# Patient Record
Sex: Male | Born: 1975
Health system: Southern US, Community
[De-identification: ages and names within clinical notes are randomized; demographics above are authoritative.]

## PROBLEM LIST (undated history)

## (undated) DIAGNOSIS — E079 Disorder of thyroid, unspecified: Secondary | ICD-10-CM

## (undated) DIAGNOSIS — K219 Gastro-esophageal reflux disease without esophagitis: Secondary | ICD-10-CM

## (undated) HISTORY — PX: UPPER GASTROINTESTINAL ENDOSCOPY: SHX188

## (undated) HISTORY — PX: COLONOSCOPY: SHX174

## (undated) HISTORY — PX: VASECTOMY: SHX75

## (undated) HISTORY — DX: Disorder of thyroid, unspecified: E07.9

## (undated) HISTORY — DX: Gastro-esophageal reflux disease without esophagitis: K21.9

---

## 2010-03-25 ENCOUNTER — Other Ambulatory Visit: Payer: Self-pay | Admitting: Sports Medicine

## 2010-03-25 DIAGNOSIS — M25561 Pain in right knee: Secondary | ICD-10-CM

## 2010-03-26 ENCOUNTER — Other Ambulatory Visit: Payer: Self-pay | Admitting: Sports Medicine

## 2010-03-26 DIAGNOSIS — Z139 Encounter for screening, unspecified: Secondary | ICD-10-CM

## 2010-03-27 ENCOUNTER — Other Ambulatory Visit: Payer: Self-pay

## 2010-03-28 ENCOUNTER — Ambulatory Visit
Admission: RE | Admit: 2010-03-28 | Discharge: 2010-03-28 | Disposition: A | Discharge status: Home / Self Care | Payer: Self-pay | Source: Ambulatory Visit | Attending: Sports Medicine | Admitting: Sports Medicine

## 2010-03-28 DIAGNOSIS — M25561 Pain in right knee: Secondary | ICD-10-CM

## 2010-03-28 DIAGNOSIS — Z139 Encounter for screening, unspecified: Secondary | ICD-10-CM

## 2011-03-10 DIAGNOSIS — N200 Calculus of kidney: Secondary | ICD-10-CM

## 2011-03-10 HISTORY — DX: Calculus of kidney: N20.0

## 2012-04-11 DIAGNOSIS — K760 Fatty (change of) liver, not elsewhere classified: Secondary | ICD-10-CM | POA: Insufficient documentation

## 2015-03-03 ENCOUNTER — Ambulatory Visit (INDEPENDENT_AMBULATORY_CARE_PROVIDER_SITE_OTHER): Payer: BLUE CROSS/BLUE SHIELD | Admitting: Family Medicine

## 2015-03-03 ENCOUNTER — Encounter: Payer: Self-pay | Admitting: Family Medicine

## 2015-03-03 VITALS — BP 130/87 | HR 90 | Temp 98.0°F | Resp 20 | Ht 76.0 in | Wt 237.5 lb

## 2015-03-03 DIAGNOSIS — S29011A Strain of muscle and tendon of front wall of thorax, initial encounter: Secondary | ICD-10-CM | POA: Diagnosis not present

## 2015-03-03 DIAGNOSIS — K219 Gastro-esophageal reflux disease without esophagitis: Secondary | ICD-10-CM | POA: Insufficient documentation

## 2015-03-03 DIAGNOSIS — Z7689 Persons encountering health services in other specified circumstances: Secondary | ICD-10-CM | POA: Insufficient documentation

## 2015-03-03 DIAGNOSIS — E039 Hypothyroidism, unspecified: Secondary | ICD-10-CM | POA: Insufficient documentation

## 2015-03-03 DIAGNOSIS — Z6828 Body mass index (BMI) 28.0-28.9, adult: Secondary | ICD-10-CM

## 2015-03-03 DIAGNOSIS — R059 Cough, unspecified: Secondary | ICD-10-CM

## 2015-03-03 DIAGNOSIS — Z0000 Encounter for general adult medical examination without abnormal findings: Secondary | ICD-10-CM | POA: Diagnosis not present

## 2015-03-03 DIAGNOSIS — Z7189 Other specified counseling: Secondary | ICD-10-CM | POA: Diagnosis not present

## 2015-03-03 DIAGNOSIS — R05 Cough: Secondary | ICD-10-CM

## 2015-03-03 NOTE — Patient Instructions (Addendum)
Pleurisy Pleurisy is an inflammation and swelling of the lining of the lungs (pleura). Because of this inflammation, it hurts to breathe. It can be aggravated by coughing, laughing, or deep breathing. Pleurisy is often caused by an underlying infection or disease.  HOME CARE INSTRUCTIONS  Monitor your pleurisy for any changes. The following actions may help to alleviate any discomfort you are experiencing:  Medicine may help with pain. Only take over-the-counter or prescription medicines for pain, discomfort, or fever as directed by your health care provider.  Only take antibiotic medicine as directed. Make sure to finish it even if you start to feel better. SEEK MEDICAL CARE IF:   Your pain is not controlled with medicine or is increasing.  You have an increase in pus-like (purulent) secretions brought up with coughing. SEEK IMMEDIATE MEDICAL CARE IF:   You have blue or dark lips, fingernails, or toenails.  You are coughing up blood.  You have increased difficulty breathing.  You have continuing pain unrelieved by medicine or pain lasting more than 1 week.  You have pain that radiates into your neck, arms, or jaw.  You develop increased shortness of breath or wheezing.  You develop a fever, rash, vomiting, fainting, or other serious symptoms. MAKE SURE YOU:  Understand these instructions.   Will watch your condition.   Will get help right away if you are not doing well or get worse.    This information is not intended to replace advice given to you by your health care provider. Make sure you discuss any questions you have with your health care provider.   Document Released: 02/08/2005 Document Revised: 10/11/2012 Document Reviewed: 07/23/2012 Elsevier Interactive Patient Education [employer]2016 Elsevier Inc.  It was a pleasure meeting you. Continue to use heating pad for chest and aleve 2x a day for 5 days.  If no improvement be seen sooner.  Complete physical within 2 months  with fasting labs prior.

## 2015-03-03 NOTE — Progress Notes (Signed)
Subjective:    Patient ID: Andres Brooks, male    DOB: November 06, 1975, 40 y.o.   MRN: [id number]  HPI   Patient presents for new patient establishment with complaints of cough and right chest wall discomfort. All past medical history, surgical history, allergies, family history, immunizations and social history was obtained from the patient today and entered into the electronic medical record. Records are requested from her prior PCP, and will be reviewed at the time they are received. All medical records will be updated at that time.  Cough/right chest wall discomfort: Patient states that on December 1 he darted experiencing a cough, and went to [facility] mid December. At that time they prescribed him a Z-Pak/prednisone in which he completed as prescribed. Stated Dec.1, went to [facility], mid December, z-pack and steroid. He states the cough has never completely resolved. He states it has much improved over the last few days. He is not taking anything to suppress the cough. He reports over the weekend feeling right-sided chest discomfort, that he is uncertain if it's related to the cough. He did not receive a chest x-ray at the time of his treatment mid-December. He states he had broken his ribs prior, and his chest discomfort feels mildly similar to that. He reports headaches when he takes a deep breath. He does admit that he was shoveling snow over the weekend, and fixing his truck and he is right-handed. He states pushing on his chest can reproduce the pain. Sometimes with moving his arms around can reproduce the pain. He has not taken any medications to help with chest discomfort. He has used a heating pad over the area and has gotten some relief.   Past Medical History  Diagnosis Date  . Thyroid disease   . GERD (gastroesophageal reflux disease)    Allergies  Allergen Reactions  . Ciprofloxacin Other (See Comments)    Elevated creatinine   Past Surgical History  Procedure Laterality Date  .  Vasectomy    . Colonoscopy      no polyps   Family History  Problem Relation Age of Onset  . Breast cancer Mother   . Hypertension Mother   . Arthritis Mother   . Skin cancer Brother   . Breast cancer Maternal Aunt   . Heart disease Maternal Uncle 60    33    Social History   Social History  . Marital Status: Married    Spouse Name: N/A  . Number of Children: N/A  . Years of Education: N/A   Occupational History  . Not on file.   Social History Main Topics  . Smoking status: Never Smoker   . Smokeless tobacco: Never Used  . Alcohol Use: 1.2 oz/week    2 Glasses of wine per week  . Drug Use: No  . Sexual Activity: Yes   Other Topics Concern  . Not on file   Social History Narrative   Married, [place].   Has two daughters, Donnetta Simpers and Clide Dales.   [employer]. College education.   Drink caffeinated beverages. Uses herbal remedies. Takes a daily vitamin.   Wears his seatbelt, wears a bicycle helmet. Smoke alarm in the home. Firearms in the home.   Feels safe in his relationship.    Review of Systems Negative, with the exception of above mentioned in HPI     Objective:   Physical Exam BP 130/87 mmHg  Pulse 90  Temp(Src) 98 F (36.7 C)  Resp 20  Ht _0  (1.93  m)  Wt 237 lb 8 oz (107.729 kg)  BMI 28.92 kg/m2  SpO2 96% Gen: Afebrile. No acute distress. Nontoxic in appearance, well-developed, well-nourished, Caucasian male. HENT: AT. [place]. Bilateral TM visualized and normal in appearance. MMM. Bilateral nares without erythema or swelling. Throat without erythema or exudates. No cough on exam, no hoarseness on exam. Eyes:Pupils Equal Round Reactive to light, Extraocular movements intact,  Conjunctiva without redness, discharge or icterus. Neck/lymp/endocrine: Supple, no lymphadenopathy, no thyromegaly CV: RRR no murmur appreciated, no edema, +2/4 P posterior tibialis pulses Chest: CTAB, no wheeze or crackles. Normal respiratory effort. Good air movement. Abd:  Soft. Flat. NTND. BS present. No Masses palpated.  MSK: No erythema, no soft tissue swelling, mild tenderness to palpation of right chest wall. Mild reproduction of discomfort with full range of motion of arms. No bony deformities of ribs. No tenderness to palpation over rib heads. Skin: No rashes, purpura or petechiae. No erythema, bruising, or skin changes over chest wall. Neuro: Normal gait. PERLA. EOMi. Alert. Oriented x3  Psych: Mildly anxious, normal dress and demeanor. Normal speech. Normal thought content and judgment.     Assessment & Plan:  Andres Brooks is a 40 y.o. male present for new pt establishment with complaints of cough.   Cough: Patient originally uses his main complaint, but then states that the cough has almost completely resolved. Possibly postviral cough. Consider chest x-ray if doesn't resolve.  Chest wall muscle strain, initial encounter - Patient's exam is consistent with her pleurisy or muscle strain. - Advised the use of NSAIDs such as Aleve 2 times a day for the next 5 days. Heat therapy. If pain worsens, associated with shortness of breath, chest pain, diaphoresis or nausea he states be seen immediately.  Health maintenance examination - Discussed health maintenance, ABS on health maintenance was provided to him today. He was encouraged to make a complete physical exam appointment within the next few months. Future labs have been placed, for him to have completed at least a few days prior to his office appointment. - Lipid panel; Future - CBC w/Diff; Future - Comp Met (CMET); Future - TSH; Future - HgB A1c; Future - B12; Future - VITAMIN D 25 Hydroxy (Vit-D Deficiency, Fractures); Future  Gastroesophageal reflux disease, esophagitis presence not specified - Continue omeprazole as needed  Hypothyroidism, unspecified hypothyroidism type - TSH future lab placed today.  CPE within 2 months

## 2015-03-04 ENCOUNTER — Encounter: Payer: Self-pay | Admitting: Family Medicine

## 2015-03-04 DIAGNOSIS — R05 Cough: Secondary | ICD-10-CM | POA: Insufficient documentation

## 2015-03-04 DIAGNOSIS — R059 Cough, unspecified: Secondary | ICD-10-CM | POA: Insufficient documentation

## 2015-03-04 DIAGNOSIS — E663 Overweight: Secondary | ICD-10-CM | POA: Insufficient documentation

## 2015-03-10 ENCOUNTER — Telehealth: Payer: Self-pay | Admitting: Family Medicine

## 2015-03-10 ENCOUNTER — Ambulatory Visit (HOSPITAL_BASED_OUTPATIENT_CLINIC_OR_DEPARTMENT_OTHER)
Admission: RE | Admit: 2015-03-10 | Discharge: 2015-03-10 | Disposition: A | Discharge status: Home / Self Care | Payer: BLUE CROSS/BLUE SHIELD | Source: Ambulatory Visit | Attending: Family Medicine | Admitting: Family Medicine

## 2015-03-10 DIAGNOSIS — R059 Cough, unspecified: Secondary | ICD-10-CM

## 2015-03-10 DIAGNOSIS — R05 Cough: Secondary | ICD-10-CM

## 2015-03-10 DIAGNOSIS — R079 Chest pain, unspecified: Secondary | ICD-10-CM | POA: Insufficient documentation

## 2015-03-10 NOTE — Telephone Encounter (Signed)
Reviewed results with patient. Patient will follow up if no improvement.

## 2015-03-10 NOTE — Telephone Encounter (Signed)
Please advise pt I do want a cxr, I have ordered one through [facility].

## 2015-03-10 NOTE — Telephone Encounter (Signed)
Patient is still having symptoms. He wants to know if Dr. Claiborne BillingsKuneff thinks he should get a chest xray. Please call.

## 2015-03-10 NOTE — Telephone Encounter (Signed)
Spoke with patient gave information regarding xray order.

## 2015-03-10 NOTE — Telephone Encounter (Signed)
Please call patient: His chest x-ray is normal.

## 2015-03-17 ENCOUNTER — Ambulatory Visit (INDEPENDENT_AMBULATORY_CARE_PROVIDER_SITE_OTHER): Payer: BLUE CROSS/BLUE SHIELD | Admitting: Family Medicine

## 2015-03-17 ENCOUNTER — Encounter: Payer: Self-pay | Admitting: Family Medicine

## 2015-03-17 VITALS — BP 112/80 | HR 75 | Temp 98.1°F | Resp 20 | Wt 236.8 lb

## 2015-03-17 DIAGNOSIS — L237 Allergic contact dermatitis due to plants, except food: Secondary | ICD-10-CM

## 2015-03-17 MED ORDER — BETAMETHASONE DIPROPIONATE 0.05 % EX CREA: TOPICAL_CREAM | Freq: Two times a day (BID) | CUTANEOUS | Status: DC

## 2015-03-17 MED ORDER — METHYLPREDNISOLONE ACETATE 80 MG/ML IJ SUSP
80.0000 mg | Freq: Once | INTRAMUSCULAR | Status: AC
  Administered 2015-03-17: 80 mg via INTRAMUSCULAR

## 2015-03-17 NOTE — Progress Notes (Signed)
Patient ID: Andres Brooks, male   DOB: 18-Apr-1975, 40 y.o.   MRN: [id number]    Andres Brooks , 04/18/1975, 40 y.o., male MRN: [id number]  CC:  Subjective: Pt presents for an acute OV with complaints of poison ivy of  5 days of duration.  Associated symptoms include pruritis.Pt has tried OTC steroid cream and calamine lotion. He feels the blisters are starting to spread up his calves. He endorses multiple large blisters surrounding bilateral ankles. He is unable to wear socks or shoes. He is able to sleep.   Allergies  Allergen Reactions  . Ciprofloxacin Other (See Comments)    Elevated creatinine   Social History  Substance Use Topics  . Smoking status: Never Smoker   . Smokeless tobacco: Never Used  . Alcohol Use: 1.2 oz/week    2 Glasses of wine per week   Past Medical History  Diagnosis Date  . Thyroid disease   . GERD (gastroesophageal reflux disease)    Past Surgical History  Procedure Laterality Date  . Vasectomy    . Colonoscopy      no polyps   Family History  Problem Relation Age of Onset  . Breast cancer Mother   . Hypertension Mother   . Arthritis Mother   . Skin cancer Brother   . Breast cancer Maternal Aunt   . Heart disease Maternal Uncle 33    33      Medication List       This list is accurate as of: 03/17/15  3:53 PM.  Always use your most recent med list.               levothyroxine 150 MCG tablet  Commonly known as:  SYNTHROID, LEVOTHROID  TAKE 1 TABLET BY MOUTH EVERY DAY ON AN EMPTY STOMACH     omeprazole 40 MG capsule  Commonly known as:  PRILOSEC  TK ONE C PO  DAILY         ROS: Negative, with the exception of above mentioned in HPI  Objective:  BP 112/80 mmHg  Pulse 75  Temp(Src) 98.1 F (36.7 C)  Resp 20  Wt 236 lb 12 oz (107.389 kg)  SpO2 98% Body mass index is 28.83 kg/(m^2). Gen: Afebrile. No acute distress. Nontoxic in appearance.  HENT: AT. [place]. MMM, no oral lesions.  Eyes:Pupils Equal Round Reactive to light, Extraocular  movements intact,  Conjunctiva without redness, discharge or icterus. Skin: No purpura or petechiae. Bilateral circumferential blistering of ankles/lower calf. Large blisters, some draining. No super infection. No erythema.  Neuro: Normal gait. PERLA. EOMi. Alert. Oriented x3   Assessment/Plan: Andres Brooks is a 40 y.o. male present for acute OV for 1. Poison ivy dermatitis - pt cautioned on high potency steroid use. No face, axilla, groin or skin folds. Short term use only as directed.  - Continue benadryl/ topical for comfort - betamethasone dipropionate (DIPROLENE) 0.05 % cream; Apply topically 2 (two) times daily.  Dispense: 30 g; Refill: 0 - methylPREDNISolone acetate (DEPO-MEDROL) injection 80 mg; Inject 1 mL (80 mg total) into the muscle once. - AVS on contact dermatitis provided.   Noni Stonesifer Claiborne Billings, DO  [facility]

## 2015-03-17 NOTE — Patient Instructions (Signed)
Contact Dermatitis Dermatitis is redness, soreness, and swelling (inflammation) of the skin. Contact dermatitis is a reaction to certain substances that touch the skin. There are two types of contact dermatitis:   Irritant contact dermatitis. This type is caused by something that irritates your skin, such as dry hands from washing them too much. This type does not require previous exposure to the substance for a reaction to occur. This type is more common.  Allergic contact dermatitis. This type is caused by a substance that you are allergic to, such as a nickel allergy or poison ivy. This type only occurs if you have been exposed to the substance (allergen) before. Upon a repeat exposure, your body reacts to the substance. This type is less common. CAUSES  Many different substances can cause contact dermatitis. Irritant contact dermatitis is most commonly caused by exposure to:   Makeup.   Soaps.   Detergents.   Bleaches.   Acids.   Metal salts, such as nickel.  Allergic contact dermatitis is most commonly caused by exposure to:   Poisonous plants.   Chemicals.   Jewelry.   Latex.   Medicines.   Preservatives in products, such as clothing.  RISK FACTORS This condition is more likely to develop in:   People who have jobs that expose them to irritants or allergens.  People who have certain medical conditions, such as asthma or eczema.  SYMPTOMS  Symptoms of this condition may occur anywhere on your body where the irritant has touched you or is touched by you. Symptoms include:  Dryness or flaking.   Redness.   Cracks.   Itching.   Pain or a burning feeling.   Blisters.  Drainage of small amounts of blood or clear fluid from skin cracks. With allergic contact dermatitis, there may also be swelling in areas such as the eyelids, mouth, or genitals.  DIAGNOSIS  This condition is diagnosed with a medical history and physical exam. A patch skin test  may be performed to help determine the cause. If the condition is related to your job, you may need to see an occupational medicine specialist. TREATMENT Treatment for this condition includes figuring out what caused the reaction and protecting your skin from further contact. Treatment may also include:   Steroid creams or ointments. Oral steroid medicines may be needed in more severe cases.  Antibiotics or antibacterial ointments, if a skin infection is present.  Antihistamine lotion or an antihistamine taken by mouth to ease itching.  A bandage (dressing). HOME CARE INSTRUCTIONS Skin Care  Moisturize your skin as needed.   Apply cool compresses to the affected areas.  Try taking a bath with:  Epsom salts. Follow the instructions on the packaging. You can get these at your local pharmacy or grocery store.  Baking soda. Pour a small amount into the bath as directed by your health care provider.  Colloidal oatmeal. Follow the instructions on the packaging. You can get this at your local pharmacy or grocery store.  Try applying baking soda paste to your skin. Stir water into baking soda until it reaches a paste-like consistency.  Do not scratch your skin.  Bathe less frequently, such as every other day.  Bathe in lukewarm water. Avoid using hot water. Medicines  Take or apply over-the-counter and prescription medicines only as told by your health care provider.   If you were prescribed an antibiotic medicine, take or apply your antibiotic as told by your health care provider. Do not stop using the   antibiotic even if your condition starts to improve. General Instructions  Keep all follow-up visits as told by your health care provider. This is important.  Avoid the substance that caused your reaction. If you do not know what caused it, keep a journal to try to track what caused it. Write down:  What you eat.  What cosmetic products you use.  What you drink.  What  you wear in the affected area. This includes jewelry.  If you were given a dressing, take care of it as told by your health care provider. This includes when to change and remove it. SEEK MEDICAL CARE IF:   Your condition does not improve with treatment.  Your condition gets worse.  You have signs of infection such as swelling, tenderness, redness, soreness, or warmth in the affected area.  You have a fever.  You have new symptoms. SEEK IMMEDIATE MEDICAL CARE IF:   You have a severe headache, neck pain, or neck stiffness.  You vomit.  You feel very sleepy.  You notice red streaks coming from the affected area.  Your bone or joint underneath the affected area becomes painful after the skin has healed.  The affected area turns darker.  You have difficulty breathing.   This information is not intended to replace advice given to you by your health care provider. Make sure you discuss any questions you have with your health care provider.   Document Released: 02/06/2000 Document Revised: 10/30/2014 Document Reviewed: 06/26/2014 Elsevier Interactive Patient Education 2016 [employer].   Steroid cream is high potency. Do not use long term or on face, skin folds, arm pits or groin.

## 2015-04-30 ENCOUNTER — Other Ambulatory Visit (INDEPENDENT_AMBULATORY_CARE_PROVIDER_SITE_OTHER): Payer: BLUE CROSS/BLUE SHIELD

## 2015-04-30 DIAGNOSIS — R7989 Other specified abnormal findings of blood chemistry: Secondary | ICD-10-CM

## 2015-04-30 DIAGNOSIS — Z0000 Encounter for general adult medical examination without abnormal findings: Secondary | ICD-10-CM | POA: Diagnosis not present

## 2015-04-30 LAB — COMPREHENSIVE METABOLIC PANEL
ALT: 52 U/L (ref 0–53)
AST: 30 U/L (ref 0–37)
Albumin: 5 g/dL (ref 3.5–5.2)
Alkaline Phosphatase: 62 U/L (ref 39–117)
BUN: 16 mg/dL (ref 6–23)
CO2: 26 mEq/L (ref 19–32)
Calcium: 9.8 mg/dL (ref 8.4–10.5)
Chloride: 104 mEq/L (ref 96–112)
Creatinine, Ser: 0.99 mg/dL (ref 0.40–1.50)
GFR: 90.08 mL/min (ref >60.00)
Glucose, Bld: 78 mg/dL (ref 70–99)
Potassium: 4.5 mEq/L (ref 3.5–5.1)
Sodium: 141 mEq/L (ref 135–145)
Total Bilirubin: 0.7 mg/dL (ref 0.2–1.2)
Total Protein: 7.1 g/dL (ref 6.0–8.3)

## 2015-04-30 LAB — HEMOGLOBIN A1C: HEMOGLOBIN A1C: 5.3 % (ref 4.6–6.5)

## 2015-04-30 LAB — CBC WITH DIFFERENTIAL/PLATELET
Basophils Absolute: 0.2 10*3/uL — ABNORMAL HIGH (ref 0.0–0.1)
Basophils Relative: 2.3 % (ref 0.0–3.0)
Eosinophils Absolute: 0.3 10*3/uL (ref 0.0–0.7)
Eosinophils Relative: 3.3 % (ref 0.0–5.0)
HCT: 45.9 % (ref 39.0–52.0)
Hemoglobin: 15.6 g/dL (ref 13.0–17.0)
Lymphocytes Relative: 37.2 % (ref 12.0–46.0)
Lymphs Abs: 3.1 10*3/uL (ref 0.7–4.0)
MCHC: 34 g/dL (ref 30.0–36.0)
MCV: 84.3 fl (ref 78.0–100.0)
Monocytes Absolute: 0.5 10*3/uL (ref 0.1–1.0)
Monocytes Relative: 5.9 % (ref 3.0–12.0)
Neutro Abs: 4.2 10*3/uL (ref 1.4–7.7)
Neutrophils Relative %: 51.3 % (ref 43.0–77.0)
Platelets: 262 10*3/uL (ref 150.0–400.0)
RBC: 5.45 Mil/uL (ref 4.22–5.81)
RDW: 13.7 % (ref 11.5–15.5)
WBC: 8.2 10*3/uL (ref 4.0–10.5)

## 2015-04-30 LAB — LIPID PANEL
CHOL/HDL RATIO: 6
Cholesterol: 217 mg/dL — ABNORMAL HIGH (ref 0–200)
HDL: 37 mg/dL — ABNORMAL LOW (ref >39.00)
NONHDL: 180.38
Triglycerides: 311 mg/dL — ABNORMAL HIGH (ref 0.0–149.0)
VLDL: 62.2 mg/dL — ABNORMAL HIGH (ref 0.0–40.0)

## 2015-04-30 LAB — VITAMIN B12: Vitamin B-12: 246 pg/mL (ref 211–911)

## 2015-04-30 LAB — LDL CHOLESTEROL, DIRECT: Direct LDL: 117 mg/dL

## 2015-04-30 LAB — TSH: TSH: 2.71 u[IU]/mL (ref 0.35–4.50)

## 2015-04-30 LAB — VITAMIN D 25 HYDROXY (VIT D DEFICIENCY, FRACTURES): VITD: 18.12 ng/mL — ABNORMAL LOW (ref 30.00–100.00)

## 2015-05-02 ENCOUNTER — Ambulatory Visit (INDEPENDENT_AMBULATORY_CARE_PROVIDER_SITE_OTHER): Payer: BLUE CROSS/BLUE SHIELD | Admitting: Family Medicine

## 2015-05-02 ENCOUNTER — Encounter: Payer: Self-pay | Admitting: Family Medicine

## 2015-05-02 VITALS — BP 121/82 | HR 86 | Temp 98.5°F | Resp 20 | Ht 76.0 in | Wt 234.0 lb

## 2015-05-02 DIAGNOSIS — E538 Deficiency of other specified B group vitamins: Secondary | ICD-10-CM | POA: Diagnosis not present

## 2015-05-02 DIAGNOSIS — Z114 Encounter for screening for human immunodeficiency virus [HIV]: Secondary | ICD-10-CM | POA: Insufficient documentation

## 2015-05-02 DIAGNOSIS — E559 Vitamin D deficiency, unspecified: Secondary | ICD-10-CM | POA: Insufficient documentation

## 2015-05-02 DIAGNOSIS — Z0000 Encounter for general adult medical examination without abnormal findings: Secondary | ICD-10-CM | POA: Diagnosis not present

## 2015-05-02 DIAGNOSIS — E781 Pure hyperglyceridemia: Secondary | ICD-10-CM | POA: Diagnosis not present

## 2015-05-02 MED ORDER — VITAMIN B-12 1000 MCG PO TABS: ORAL_TABLET | ORAL | Status: DC

## 2015-05-02 MED ORDER — VITAMIN D (ERGOCALCIFEROL) 1.25 MG (50000 UNIT) PO CAPS: 50000.0000 [IU] | ORAL_CAPSULE | ORAL | Status: DC

## 2015-05-02 NOTE — Progress Notes (Signed)
Patient ID: Andres Brooks, male   DOB: 03-18-1975, 40 y.o.   MRN: [id number]      Patient ID: Andres Brooks, male  DOB: 04-Oct-1975, 40 y.o.   MRN: [id number]  Subjective:  Andres Brooks is a 40 y.o. male present for annual exam  All past medical history, surgical history, allergies, family history, immunizations, medications and social history were updated in the electronic medical record today. All recent labs, ED visits and hospitalizations within the last year were reviewed.   Fatigue: Patient still complains of fatigue. Reviewed all labs with patient today, that included low vitamin D and B 12. She states he falls asleep easily, his wife does report that he snores, he feels he does sleep very restlessly. He states that he wakes up tired and not rested. He does have routine of going to bed around 9 to 10:00 PM waking at 5 AM. He denies falling asleep inappropriately at the dinner table, stoplights etc. CMP, CBC, A1c and TSH within normal range  Health maintenance:  Colonoscopy: no FHx, screen 50 Immunizations: UTD tdap, flu declined.  Infectious disease screening: HIV indicated--> next lab draw (with B12 and D) PSA: screen at 40, no FHX Assistive device: None Oxygen use: None Patient has a Dental home. Hospitalizations/ED visits: None   Past Medical History  Diagnosis Date  . Thyroid disease   . GERD (gastroesophageal reflux disease)    Allergies  Allergen Reactions  . Ciprofloxacin Other (See Comments)    Elevated creatinine   Past Surgical History  Procedure Laterality Date  . Vasectomy    . Colonoscopy      no polyps   Family History  Problem Relation Age of Onset  . Breast cancer Mother   . Hypertension Mother   . Arthritis Mother   . Skin cancer Brother   . Breast cancer Maternal Aunt   . Heart disease Maternal Uncle 11    33    Social History   Social History  . Marital Status: Married    Spouse Name: N/A  . Number of Children: N/A  . Years of Education: N/A    Occupational History  . Not on file.   Social History Main Topics  . Smoking status: Never Smoker   . Smokeless tobacco: Never Used  . Alcohol Use: 1.2 oz/week    2 Glasses of wine per week  . Drug Use: No  . Sexual Activity: Yes   Other Topics Concern  . Not on file   Social History Narrative   Married, Andres Brooks.   Has two daughters, Andres Brooks and Andres Brooks.   [employer]. College education.   Drink caffeinated beverages. Uses herbal remedies. Takes a daily vitamin.   Wears his seatbelt, wears a bicycle helmet. Smoke alarm in the home. Firearms in the home.   Feels safe in his relationship.    ROS: Negative, with the exception of above mentioned in HPI  Objective: BP 121/82 mmHg  Pulse 86  Temp(Src) 98.5 F (36.9 C)  Resp 20  Ht  (1.93 m)  Wt 234 lb (106.142 kg)  BMI 28.50 kg/m2  SpO2 96% Gen: Afebrile. No acute distress. Nontoxic in appearance, well-developed, well-nourished, male, Very pleasant HENT: AT. Andres Brooks. Bilateral TM visualized and normal in appearance, normal external auditory canal. MMM, no oral lesions, good dentition. Bilateral nares without erythema or swelling. Throat without erythema, ulcerations or exudates. No Cough on exam, no hoarseness on exam. Eyes:Pupils Equal Round Reactive to light, Extraocular movements intact,  Conjunctiva without redness,  discharge or icterus. Neck/lymp/endocrine: Supple, no lymphadenopathy, no thyromegaly CV: RRR no m/c/g/r, no edema, +2/4 P posterior tibialis pulses. No carotid bruits. No JVD. Chest: CTAB, no wheeze, rhonchi or crackles. Normal Respiratory effort. Good Air movement. Abd: Soft. Flat. NTND. BS present. No Masses palpated. No hepatosplenomegaly. No rebound tenderness or guarding. Skin: No rashes, purpura or petechiae. Warm and well-perfused. Skin intact. Neuro/Msk:  Normal gait. PERLA. EOMi. Alert. Oriented x3.  Cranial nerves II through XII intact. Muscle strength 5/5 upper and lower extremity. DTRs equal  bilaterally. Psych: Normal affect, dress and demeanor. Normal speech. Normal thought content and judgment.  Assessment/plan: Andres Brooks is a 40 y.o. male present for annual exam.  1. Vitamin D deficiency - retest 12 weeks, discussed labs today  - Vitamin D, Ergocalciferol, (DRISDOL) 50000 units CAPS capsule; Take 1 capsule (50,000 Units total) by mouth every 7 (seven) days.  Dispense: 12 capsule; Refill: 0 - If still fatigued after supplement, will consider sleep study, pt amendable to plan.    2. B12 deficiency - retest 12 weeks, discussed labs today  - Vitamin D, Ergocalciferol, (DRISDOL) 50000 units CAPS capsule; Take 1 capsule (50,000 Units total) by mouth every 7 (seven) days.  Dispense: 12 capsule; Refill: 0 - vitamin B-12 (CYANOCOBALAMIN) 1000 MCG tablet; 1000 mcg daily for 7 days and then 1000 mcg weekly  Dispense: 18 tablet; Refill: 0  3. Visit for preventive health examination Colonoscopy: no FHx, screen 50 Immunizations: UTD tdap, flu declined.  Infectious disease screening: HIV indicated--> next lab draw (with B12 and D), order placed today, pt amendable to screen.  PSA: screen at 40, no FHX Assistive device: None Oxygen use: None Patient has a Dental home. Hospitalizations/ED visits: None   4. Hypertriglyceridemia - discussed labs with pt today. Start fish oil 3-4g OTC - Discussed dietary and exercise modifications.  - retest 6 months - Patient was encouraged to exercise greater than 150 minutes a week. Patient was encouraged to choose a diet filled with fresh fruits and vegetables, and lean meats. AVS provided to patient today for education/recommendation on gender specific health and safety maintenance.  No Follow-up on file.  Electronically signed by: Andres Pacinienee Kuneff, DO Andres Brooks

## 2015-05-02 NOTE — Patient Instructions (Signed)

## 2015-06-20 DIAGNOSIS — L600 Ingrowing nail: Secondary | ICD-10-CM | POA: Diagnosis not present

## 2015-07-09 DIAGNOSIS — E049 Nontoxic goiter, unspecified: Secondary | ICD-10-CM | POA: Diagnosis not present

## 2015-07-09 DIAGNOSIS — E039 Hypothyroidism, unspecified: Secondary | ICD-10-CM | POA: Diagnosis not present

## 2015-07-29 ENCOUNTER — Other Ambulatory Visit: Payer: BLUE CROSS/BLUE SHIELD

## 2015-07-31 ENCOUNTER — Other Ambulatory Visit (INDEPENDENT_AMBULATORY_CARE_PROVIDER_SITE_OTHER): Payer: BLUE CROSS/BLUE SHIELD

## 2015-07-31 ENCOUNTER — Telehealth: Payer: Self-pay | Admitting: Family Medicine

## 2015-07-31 DIAGNOSIS — E538 Deficiency of other specified B group vitamins: Secondary | ICD-10-CM | POA: Diagnosis not present

## 2015-07-31 DIAGNOSIS — E781 Pure hyperglyceridemia: Secondary | ICD-10-CM | POA: Diagnosis not present

## 2015-07-31 LAB — LIPID PANEL
CHOL/HDL RATIO: 5
Cholesterol: 184 mg/dL (ref 0–200)
HDL: 37.7 mg/dL — AB (ref >39.00)
LDL Cholesterol: 117 mg/dL — ABNORMAL HIGH (ref 0–99)
NONHDL: 145.96
Triglycerides: 146 mg/dL (ref 0.0–149.0)
VLDL: 29.2 mg/dL (ref 0.0–40.0)

## 2015-07-31 LAB — VITAMIN B12: Vitamin B-12: 326 pg/mL (ref 211–911)

## 2015-07-31 LAB — VITAMIN D 25 HYDROXY (VIT D DEFICIENCY, FRACTURES): VITD: 42 ng/mL (ref 30.00–100.00)

## 2015-07-31 NOTE — Telephone Encounter (Signed)
Ok to draw lipids per Dr Claiborne BillingsKuneff. Misty StanleyLisa will place order.

## 2015-07-31 NOTE — Addendum Note (Signed)
Addended by: Eulah PontALBRIGHT, [place] Peifer M on: 07/31/2015 08:22 AM   Modules accepted: Orders

## 2015-07-31 NOTE — Telephone Encounter (Signed)
Patient had lab appointment today, he thought it was to check his lipids but only Vit D, Vit B12 and HIV were ordered.  Can you please advise?  Also, Patient refused HIV, he stated he didn't know why it was ordered and he didn't want to be tested.

## 2015-08-01 ENCOUNTER — Telehealth: Payer: Self-pay | Admitting: Family Medicine

## 2015-08-01 NOTE — Telephone Encounter (Signed)
I do not see pt made an appt to discus labs/fatigue follow-up. He did have labs yesterday. They are normal or have improved with therapy.  - Continue daily vit d supplement OTC and fish oil.  - If he is still having fatigue symptoms or questions, he should be encouraged to make a follow up appt.

## 2015-08-05 NOTE — Telephone Encounter (Signed)
Spoke with patient reviewed lab results and instructions. Patient verbalized understanding. He has a follow up appt scheduled in Sept.

## 2015-08-19 ENCOUNTER — Telehealth: Payer: Self-pay | Admitting: Family Medicine

## 2015-08-19 NOTE — Telephone Encounter (Signed)
Spoke with patient he needs to take OTC vit D3  1000 units daily . He doesn't need Rx for this, patient verbalized understanding.

## 2015-08-19 NOTE — Telephone Encounter (Signed)
Patient is requesting Rx for Vitamin D to be [facility]. Please call patient back

## 2015-11-07 ENCOUNTER — Ambulatory Visit: Payer: BLUE CROSS/BLUE SHIELD | Admitting: Family Medicine

## 2015-12-29 ENCOUNTER — Telehealth: Payer: Self-pay | Admitting: Family Medicine

## 2015-12-29 MED ORDER — OMEPRAZOLE 40 MG PO CPDR: 40.0000 mg | DELAYED_RELEASE_CAPSULE | Freq: Every day | ORAL | 0 refills | Status: DC

## 2015-12-29 NOTE — Telephone Encounter (Signed)
[facility]

## 2015-12-29 NOTE — Telephone Encounter (Signed)
Sent 30 day supply of omeprazole to patient pharmacy. Patient needs office visit prior to anymore refills. Left message on patient voice mail per DPR.

## 2016-01-22 ENCOUNTER — Other Ambulatory Visit: Payer: Self-pay | Admitting: Family Medicine

## 2016-06-18 ENCOUNTER — Telehealth: Payer: Self-pay | Admitting: Family Medicine

## 2016-06-18 NOTE — Telephone Encounter (Signed)
Patient has not been seen since last march unable to fill this Rx . Called patient and scheduled him for an appt on 06/22/16.

## 2016-06-18 NOTE — Telephone Encounter (Signed)
Prilosec (90 day supply) [facility] [place] 90 day. Last Rx was by prior PCP Lovie Macadamia per patient. He no longer goes there.

## 2016-06-22 ENCOUNTER — Ambulatory Visit (INDEPENDENT_AMBULATORY_CARE_PROVIDER_SITE_OTHER): Payer: BLUE CROSS/BLUE SHIELD | Admitting: Family Medicine

## 2016-06-22 ENCOUNTER — Encounter: Payer: Self-pay | Admitting: Family Medicine

## 2016-06-22 VITALS — BP 130/84 | HR 76 | Temp 97.7°F | Resp 20 | Ht 76.0 in | Wt 232.0 lb

## 2016-06-22 DIAGNOSIS — K219 Gastro-esophageal reflux disease without esophagitis: Secondary | ICD-10-CM | POA: Diagnosis not present

## 2016-06-22 DIAGNOSIS — Z79899 Other long term (current) drug therapy: Secondary | ICD-10-CM | POA: Diagnosis not present

## 2016-06-22 MED ORDER — OMEPRAZOLE 40 MG PO CPDR: 40.0000 mg | DELAYED_RELEASE_CAPSULE | Freq: Every day | ORAL | 3 refills | Status: DC

## 2016-06-22 NOTE — Patient Instructions (Signed)
Refilled on meds today, try the least possible dose daily to control symptoms.  Long term use can cause low b12, vitd and magnesium.   Barrett Esophagus Barrett esophagus occurs when the tissue that lines the esophagus changes or becomes damaged. The esophagus is the tube that carries food from the throat to the stomach. With Barrett esophagus, the cells that line the esophagus are replaced by cells that are similar to the lining of the intestines (intestinal metaplasia). Barrett esophagus itself may not cause any symptoms. However, many people who have Barrett esophagus also have gastroesophageal reflux disease (GERD), which may cause symptoms such as heartburn. Treatment may include medicines, procedures to destroy the abnormal cells, or surgery. Over time, a few people with this condition may develop cancer of the esophagus. What are the causes? The exact cause of this condition is not known. In some cases, the condition develops from damage to the lining of the esophagus caused by GERD. GERD occurs when stomach acids flow up from the stomach into the esophagus. Frequent symptoms of GERD may cause intestinal metaplasia or cause cell changes (dysplasia). What increases the risk? The following factors may make you more likely to develop this condition:  Having GERD.  Being any of the following:  Male.  White (Caucasian).  Obese.  Older than 50.  Having a hiatal hernia.  Smoking. What are the signs or symptoms? People with Barrett esophagus often have no symptoms. However, many people with this condition also have GERD. Symptoms of GERD may include:  Heartburn.  Difficulty swallowing.  Dry cough. How is this diagnosed? Barrett esophagus may be diagnosed with an exam called an upper gastrointestinal endoscopy. During this exam, a thin, flexible tube (endoscope) is passed down your esophagus. The endoscope has a light and camera on the end of it. Your health care provider uses the  endoscope to view the inside of your esophagus. During the exam, several tissue samples will be removed (biopsy) from your esophagus so they can be checked for intestinal metaplasia or dysplasia. How is this treated? Treatment for this condition may include:  Medicines (proton pump inhibitors, or PPIs) to decrease or stop GERD.  Periodic endoscopic exams to make sure that cancer is not developing.  A procedure or surgery for dysplasia. This may include:  Endoscopic removal or destruction of abnormal cells.  Removal of part of the esophagus (esophagectomy). Follow these instructions at home: Eating and drinking   Eat more fruits and vegetables.  Avoid fatty foods.  Eat small, frequent meals instead of large meals.  Avoid foods that cause heartburn. These foods include:  Coffee and alcoholic drinks.  Tomatoes and foods made with tomatoes.  Greasy or spicy foods.  Chocolate and peppermint. General instructions    Take over-the-counter and prescription medicines only as told by your health care provider.  Do not use any tobacco products, such as cigarettes, chewing tobacco, and e-cigarettes. If you need help quitting, ask your health care provider.  If your health care provider is treating you for GERD, make sure you follow all instructions and take medicines as directed.  Keep all follow-up visits as told by your health care provider. This is important. Contact a health care provider if:  You have heartburn or GERD symptoms.  You have difficulty swallowing. Get help right away if:  You have chest pain.  You are unable to swallow.  You vomit blood or material that looks like coffee grounds.  Your stool (feces) is bright red or dark.  This information is not intended to replace advice given to you by your health care provider. Make sure you discuss any questions you have with your health care provider. Document Released: 05/01/2003 Document Revised: 07/17/2015  Document Reviewed: 11/21/2014 Elsevier Interactive Patient Education  2017 [employer].

## 2016-06-22 NOTE — Progress Notes (Signed)
Andres Brooks, 41 y.o., male MRN: [id number] Patient Care Team    Relationship Specialty Notifications Start End  Andres Leatherwood, DO PCP - General Family Medicine  03/03/15     Chief Complaint  Patient presents with  . Gastroesophageal Reflux     Subjective: Pt presents for an OV with complaints of GERD. He reports h/o reflux since he was a teenager. He has been a form of PPI > 20 years. He recently ran out of omeprazole 40 mg QD dose, provider by another provider and is asking for refills today. He reports an EGD was completed when he was teenager, but has had no GI follow since. He has never identified a specific trigger. He sometimes can taper back to every other day dosing, but most days needs the 40 mg a day. He has tried to go a few days without medication and his symptoms become severe. He reports his grandmother also had severe GERD. He does have h/o vit d deficiency and B12 deficiency, which is is taking daily supplements. He takes a multi-vitamin but is uncertain if it contains magnesium.   Depression screen PHQ 2/9 05/02/2015  Decreased Interest 0  Down, Depressed, Hopeless 0  PHQ - 2 Score 0    Allergies  Allergen Reactions  . Ciprofloxacin Other (See Comments)    Elevated creatinine   Social History  Substance Use Topics  . Smoking status: Never Smoker  . Smokeless tobacco: Never Used  . Alcohol use 1.2 oz/week    2 Glasses of wine per week   Past Medical History:  Diagnosis Date  . GERD (gastroesophageal reflux disease)   . Thyroid disease    Past Surgical History:  Procedure Laterality Date  . COLONOSCOPY     no polyps  . VASECTOMY     Family History  Problem Relation Age of Onset  . Breast cancer Mother   . Hypertension Mother   . Arthritis Mother   . Skin cancer Brother   . Breast cancer Maternal Aunt   . Heart disease Maternal Uncle 33    33    Allergies as of 06/22/2016      Reactions   Ciprofloxacin Other (See Comments)   Elevated  creatinine      Medication List       Accurate as of 06/22/16  3:39 PM. Always use your most recent med list.          betamethasone dipropionate 0.05 % cream Commonly known as:  DIPROLENE Apply topically 2 (two) times daily.   levothyroxine 150 MCG tablet Commonly known as:  SYNTHROID, LEVOTHROID TAKE 1 TABLET BY MOUTH EVERY DAY ON AN EMPTY STOMACH   omeprazole 40 MG capsule Commonly known as:  PRILOSEC Take 1 capsule (40 mg total) by mouth daily. Patient needs office visit prior to anymore refills.   vitamin B-12 1000 MCG tablet Commonly known as:  CYANOCOBALAMIN 1000 mcg daily for 7 days and then 1000 mcg weekly   Vitamin D3 1000 units Caps Take 1,000 Units by mouth daily.       All past medical history, surgical history, allergies, family history, immunizations andmedications were updated in the EMR today and reviewed under the history and medication portions of their EMR.     ROS: Negative, with the exception of above mentioned in HPI   Objective:  BP 130/84 (BP Location: Right Arm, Patient Position: Sitting, Cuff Size: Normal)   Pulse 76   Temp 97.7 F (36.5 C) (  Oral)   Resp 20   Ht  (1.93 m)   Wt 232 lb (105.2 kg)   SpO2 97%   BMI 28.24 kg/m  Body mass index is 28.24 kg/m. Gen: Afebrile. No acute distress. Nontoxic in appearance, well developed, well nourished.  HENT: AT. .MMM, no oral lesions.  Eyes:Pupils Equal Round Reactive to light, Extraocular movements intact,  Conjunctiva without redness, discharge or icterus. CV: RRR  Chest: CTAB, no wheeze or crackles. Abd: Soft. NTND. BS present.   No exam data present No results found. No results found for this or any previous visit (from the past 24 hour(s)).  Assessment/Plan: Andres Brooks is a 41 y.o. male present for OV for  Gastroesophageal reflux disease without esophagitis Current use of proton pump inhibitor - discussed long term use of PPI with pt today. Lowest possible dose recommended.   - > 20 year h/o use of PPI. Yearly monitor B12, vit d and mag with h/o deficiencies in vit d and b12 already and supplemented.  - refills on omeprazole for 1 year.  - counseled on avoiding triggers.  - consider GI referral if worsening or could consider given longevity of treatment without EGD. Pt will consider.  - discussion and AVS information on Barrett's  esophagus provided to pt today,    Reviewed expectations re: course of current medical issues.  Discussed self-management of symptoms.  Outlined signs and symptoms indicating need for more acute intervention.  Patient verbalized understanding and all questions were answered.  Patient received an After-Visit Summary.   Note is dictated utilizing voice recognition software. Although note has been proof read prior to signing, occasional typographical errors still can be missed. If any questions arise, please do not hesitate to call for verification.   electronically signed by:  Andres Pacini, DO  [facility] - [place]

## 2016-08-02 DIAGNOSIS — E049 Nontoxic goiter, unspecified: Secondary | ICD-10-CM | POA: Diagnosis not present

## 2016-08-02 DIAGNOSIS — E039 Hypothyroidism, unspecified: Secondary | ICD-10-CM | POA: Diagnosis not present

## 2017-02-28 DIAGNOSIS — E049 Nontoxic goiter, unspecified: Secondary | ICD-10-CM | POA: Diagnosis not present

## 2017-02-28 DIAGNOSIS — E039 Hypothyroidism, unspecified: Secondary | ICD-10-CM | POA: Diagnosis not present

## 2017-03-29 ENCOUNTER — Encounter: Payer: Self-pay | Admitting: Family Medicine

## 2017-03-29 ENCOUNTER — Ambulatory Visit (INDEPENDENT_AMBULATORY_CARE_PROVIDER_SITE_OTHER): Payer: BLUE CROSS/BLUE SHIELD | Admitting: Family Medicine

## 2017-03-29 VITALS — BP 129/82 | HR 97 | Temp 98.3°F | Ht 76.0 in | Wt 232.8 lb

## 2017-03-29 DIAGNOSIS — J0100 Acute maxillary sinusitis, unspecified: Secondary | ICD-10-CM | POA: Diagnosis not present

## 2017-03-29 DIAGNOSIS — R6889 Other general symptoms and signs: Secondary | ICD-10-CM

## 2017-03-29 LAB — POC INFLUENZA A&B (BINAX/QUICKVUE)
Influenza A, POC: NEGATIVE
Influenza B, POC: NEGATIVE

## 2017-03-29 MED ORDER — AMOXICILLIN-POT CLAVULANATE 875-125 MG PO TABS: 1.0000 | ORAL_TABLET | Freq: Two times a day (BID) | ORAL | 0 refills | Status: DC

## 2017-03-29 NOTE — Patient Instructions (Signed)
Influenza negative today  Rest, hydrate.  + flonase, mucinex (DM if cough), nettie pot or nasal saline.  Augmentin  Prescribed if not improving or worsening over next 2-3 days, take until completed if started.  If cough present it can last up to 6-8 weeks.  F/U 2 weeks of not improved.     Sinusitis, Adult Sinusitis is soreness and inflammation of your sinuses. Sinuses are hollow spaces in the bones around your face. They are located:  Around your eyes.  In the middle of your forehead.  Behind your nose.  In your cheekbones.  Your sinuses and nasal passages are lined with a stringy fluid (mucus). Mucus normally drains out of your sinuses. When your nasal tissues get inflamed or swollen, the mucus can get trapped or blocked so air cannot flow through your sinuses. This lets bacteria, viruses, and funguses grow, and that leads to infection. Follow these instructions at home: Medicines  Take, use, or apply over-the-counter and prescription medicines only as told by your doctor. These may include nasal sprays.  If you were prescribed an antibiotic medicine, take it as told by your doctor. Do not stop taking the antibiotic even if you start to feel better. Hydrate and Humidify  Drink enough water to keep your pee (urine) clear or pale yellow.  Use a cool mist humidifier to keep the humidity level in your home above 50%.  Breathe in steam for 10-15 minutes, 3-4 times a day or as told by your doctor. You can do this in the bathroom while a hot shower is running.  Try not to spend time in cool or dry air. Rest  Rest as much as possible.  Sleep with your head raised (elevated).  Make sure to get enough sleep each night. General instructions  Put a warm, moist washcloth on your face 3-4 times a day or as told by your doctor. This will help with discomfort.  Wash your hands often with soap and water. If there is no soap and water, use hand sanitizer.  Do not smoke. Avoid being  around people who are smoking (secondhand smoke).  Keep all follow-up visits as told by your doctor. This is important. Contact a doctor if:  You have a fever.  Your symptoms get worse.  Your symptoms do not get better within 10 days. Get help right away if:  You have a very bad headache.  You cannot stop throwing up (vomiting).  You have pain or swelling around your face or eyes.  You have trouble seeing.  You feel confused.  Your neck is stiff.  You have trouble breathing. This information is not intended to replace advice given to you by your health care provider. Make sure you discuss any questions you have with your health care provider. Document Released: 07/28/2007 Document Revised: 10/05/2015 Document Reviewed: 12/04/2014 Elsevier Interactive Patient Education  [employer]2018 Elsevier Inc.

## 2017-03-29 NOTE — Progress Notes (Signed)
Andres Brooks , 1975-09-26, 42 y.o., male MRN: [id number] Patient Care Team    Relationship Specialty Notifications Start End  [name], DO PCP - General Family Medicine  03/03/15     Chief Complaint  Patient presents with  . Flu-Like Symptoms    pt c/o fatigue,cough, body ache and chills X 4days. Try Ibuprofen and Mucinex D     Subjective: Pt presents for an OV with complaints of fatigue, cough of 3-4 duration.  Associated symptoms include body aches, cold chills. He has tried mucinex and ibf. He denies nausea, vomit, diarrhea or vomit. He has not had his flu shot this year.    Depression screen [facility] 2/9 03/29/2017 05/02/2015  Decreased Interest 0 0  Down, Depressed, Hopeless 0 0  PHQ - 2 Score 0 0    Allergies  Allergen Reactions  . Ciprofloxacin Other (See Comments)    Elevated creatinine   Social History   Tobacco Use  . Smoking status: Never Smoker  . Smokeless tobacco: Never Used  Substance Use Topics  . Alcohol use: Yes    Alcohol/week: 1.2 oz    Types: 2 Glasses of wine per week   Past Medical History:  Diagnosis Date  . GERD (gastroesophageal reflux disease)   . Thyroid disease    Past Surgical History:  Procedure Laterality Date  . COLONOSCOPY     no polyps  . VASECTOMY     Family History  Problem Relation Age of Onset  . Breast cancer Mother   . Hypertension Mother   . Arthritis Mother   . Skin cancer Brother   . Breast cancer Maternal Aunt   . Heart disease Maternal Uncle 33       33    Allergies as of 03/29/2017      Reactions   Ciprofloxacin Other (See Comments)   Elevated creatinine      Medication List        Accurate as of 03/29/17  1:45 PM. Always use your most recent med list.          amoxicillin-clavulanate 875-125 MG tablet Commonly known as:  AUGMENTIN Take 1 tablet by mouth 2 (two) times daily.   levothyroxine 150 MCG tablet Commonly known as:  SYNTHROID, LEVOTHROID TAKE 1 TABLET BY MOUTH EVERY DAY ON AN EMPTY  STOMACH   omeprazole 40 MG capsule Commonly known as:  PRILOSEC Take 1 capsule (40 mg total) by mouth daily. Patient needs office visit prior to anymore refills.   vitamin B-12 1000 MCG tablet Commonly known as:  CYANOCOBALAMIN 1000 mcg daily for 7 days and then 1000 mcg weekly   Vitamin D3 1000 units Caps Take 1,000 Units by mouth daily.       All past medical history, surgical history, allergies, family history, immunizations andmedications were updated in the EMR today and reviewed under the history and medication portions of their EMR.     ROS: Negative, with the exception of above mentioned in HPI   Objective:  BP 129/82 (BP Location: Right Arm, Patient Position: Sitting, Cuff Size: Normal)   Pulse 97   Temp 98.3 F (36.8 C) (Oral)   Ht 6\' 4"  (1.93 m)   Wt 232 lb 12.8 oz (105.6 kg)   SpO2 99%   BMI 28.34 kg/m  Body mass index is 28.34 kg/m. Gen: Afebrile. No acute distress. Nontoxic in appearance, well developed, well nourished.  HENT: AT. [place]. Bilateral TM visualized w/o erythema or fullness. MMM, no oral lesions.  Bilateral nares with erythema and drainage. Throat without erythema or exudates. No PND. Mild sinus pressure. No cough on exam.  Eyes:Pupils Equal Round Reactive to light, Extraocular movements intact,  Conjunctiva without redness, discharge or icterus. Neck/lymp/endocrine: Supple,no lymphadenopathy CV: RRR  Chest: CTAB, no wheeze or crackles. Good air movement, normal resp effort.  Neuro:  Normal gait. PERLA. EOMi. Alert.  No exam data present No results found. Results for orders placed or performed in visit on 03/29/17 (from the past 24 hour(s))  POC Influenza A&B (Binax test)     Status: Abnormal   Collection Time: 03/29/17  1:40 PM  Result Value Ref Range   Influenza A, POC Negative Negative   Influenza B, POC Negative Negative    Assessment/Plan: Andres Brooks is a 42 y.o. male present for OV for  Flu-like symptoms - POC Influenza A&B (Binax  test)- negative Acute maxillary sinusitis, recurrence not specified Rest, hydrate.  + flonase, mucinex (DM if cough), nettie pot or nasal saline.  augmentin printed (hold 2-3 d) prescribed, take until completed if started Pt to hold abx unless sx worsening or not resolving in 2-3 days If cough present it can last up to 6-8 weeks.  F/U 2 weeks of not improved.     Reviewed expectations re: course of current medical issues.  Discussed self-management of symptoms.  Outlined signs and symptoms indicating need for more acute intervention.  Patient verbalized understanding and all questions were answered.  Patient received an After-Visit Summary.    Orders Placed This Encounter  Procedures  . POC Influenza A&B (Binax test)     Note is dictated utilizing voice recognition software. Although note has been proof read prior to signing, occasional typographical errors still can be missed. If any questions arise, please do not hesitate to call for verification.   electronically signed by:  Felix Pacinienee Sherrol Vicars, DO  [facility] [place]

## 2017-07-12 DIAGNOSIS — S63501A Unspecified sprain of right wrist, initial encounter: Secondary | ICD-10-CM | POA: Diagnosis not present

## 2017-08-01 DIAGNOSIS — E049 Nontoxic goiter, unspecified: Secondary | ICD-10-CM | POA: Diagnosis not present

## 2017-08-01 DIAGNOSIS — E039 Hypothyroidism, unspecified: Secondary | ICD-10-CM | POA: Diagnosis not present

## 2017-08-02 DIAGNOSIS — E039 Hypothyroidism, unspecified: Secondary | ICD-10-CM | POA: Diagnosis not present

## 2017-09-24 ENCOUNTER — Other Ambulatory Visit: Payer: Self-pay | Admitting: Family Medicine

## 2017-11-05 IMAGING — DX DG CHEST 2V
2 series · 3 of 3 positions shown · non-contrast
Comparison: None.

CLINICAL DATA: Cough and right-sided chest pain for 3 weeks.

EXAM:
CHEST  2 VIEW

[Series 1: chest pa · 0.14mm/px · 2 of 2 slices shown]
[im 1/2]
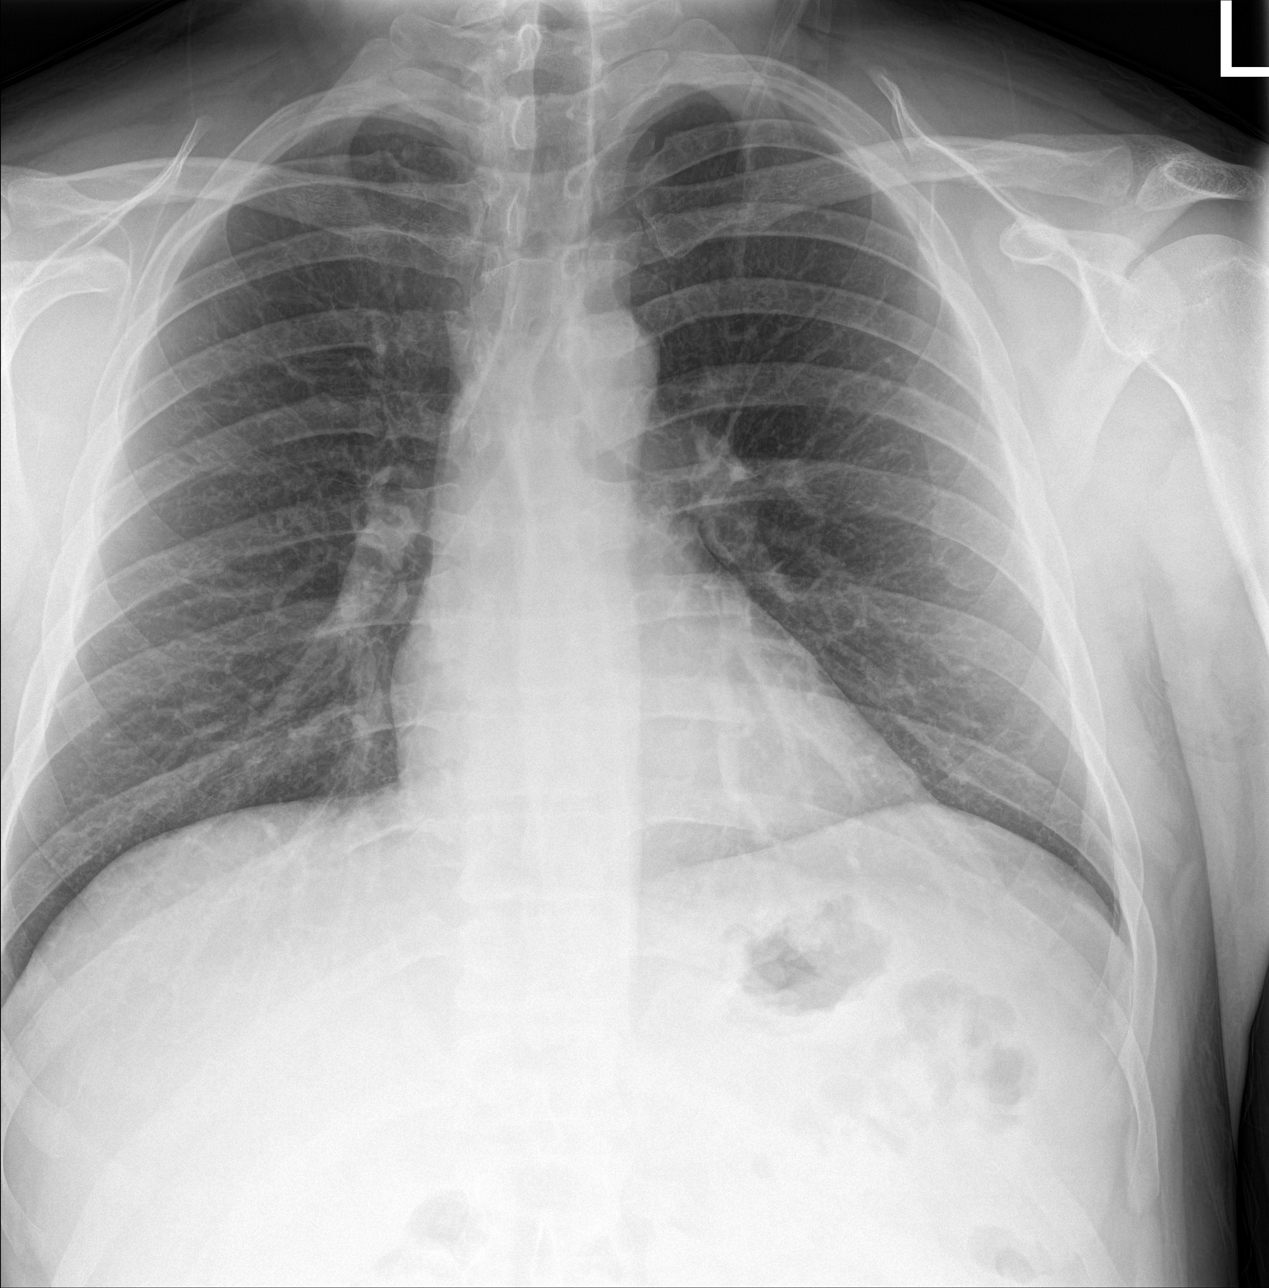
[im 2/2]
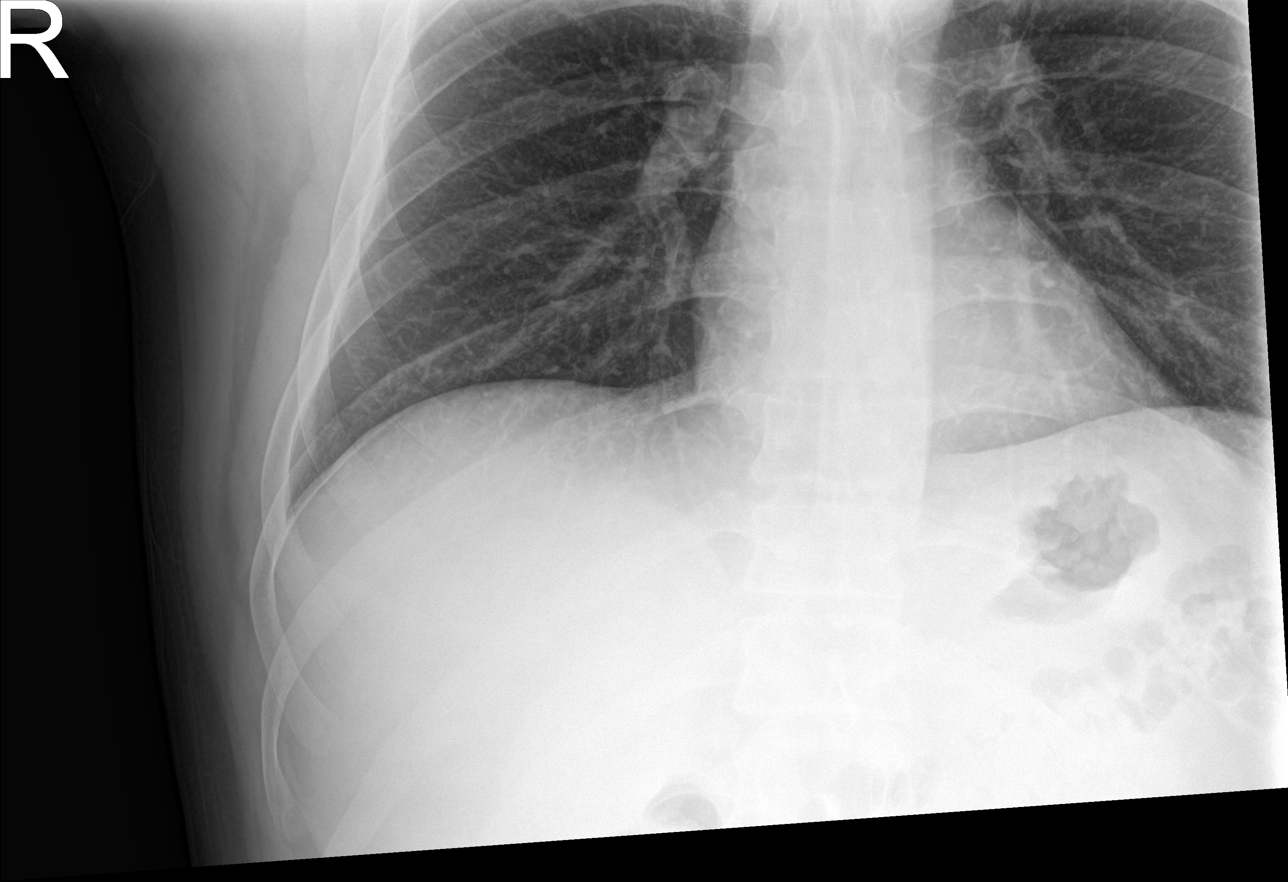

[chest lat]
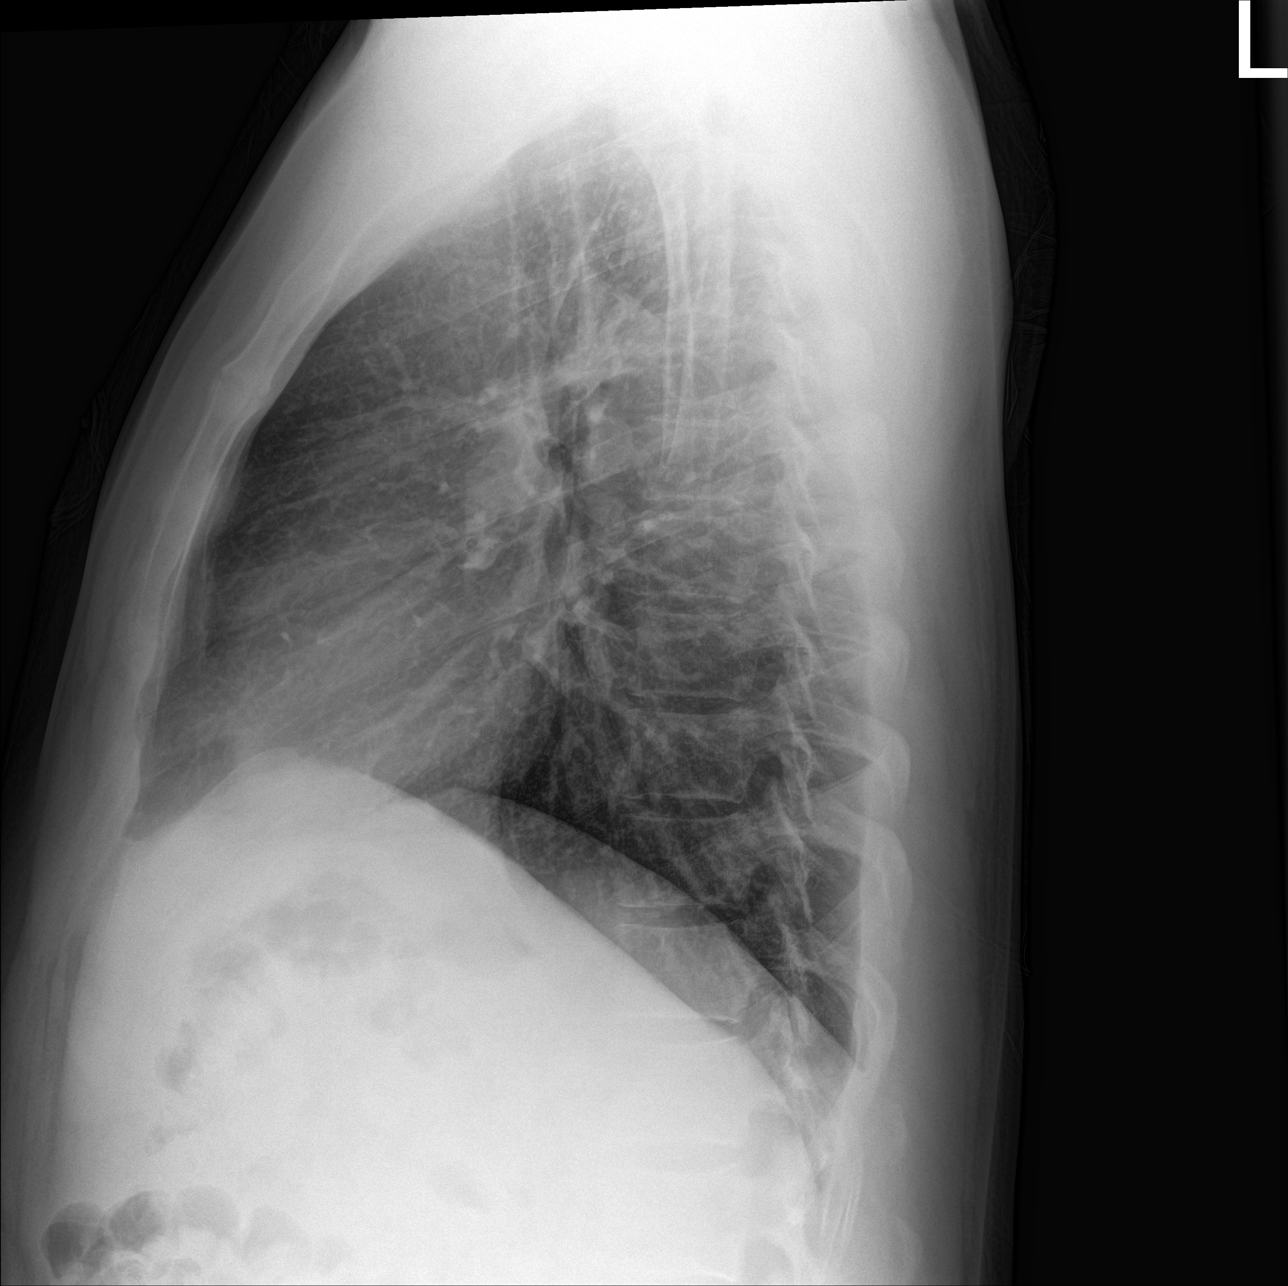

[3 of 3 positions shown; findings below may reference images not displayed]

FINDINGS: The cardiac silhouette is normal. Mediastinal contours appear
intact.

There is no evidence of focal airspace consolidation, pleural
effusion or pneumothorax.

Osseous structures are without acute abnormality. Soft tissues are
grossly normal.
IMPRESSION: No active cardiopulmonary disease.

## 2018-01-23 DIAGNOSIS — M25531 Pain in right wrist: Secondary | ICD-10-CM | POA: Diagnosis not present

## 2018-01-26 ENCOUNTER — Other Ambulatory Visit: Payer: Self-pay | Admitting: Family Medicine

## 2018-01-26 MED ORDER — OMEPRAZOLE 40 MG PO CPDR: 40.0000 mg | DELAYED_RELEASE_CAPSULE | Freq: Every day | ORAL | 0 refills | Status: DC

## 2018-01-26 NOTE — Telephone Encounter (Signed)
Pt called and scheduled OV.

## 2018-01-26 NOTE — Telephone Encounter (Signed)
90 day courtesy refill; pt has appt 02/01/18

## 2018-01-26 NOTE — Telephone Encounter (Signed)
Left VM; pt needs appt

## 2018-01-26 NOTE — Telephone Encounter (Signed)
Copied from CRM [phone]#[id number]. Topic: Quick Communication - Rx Refill/Question >> Jan 26, 2018  2:10 PM Jaquita Rectoravis, Karen A wrote: Medication: omeprazole (PRILOSEC) 40 MG capsule  Has the patient contacted their pharmacy? Yes.   (Agent: If no, request that the patient contact the pharmacy for the refill.) (Agent: If yes, when and what did the pharmacy advise?)  Preferred Pharmacy (with phone number or street name): [id number] - [place], [place] - [address] [facility] [address] [phone] (Phone) [phone] (Fax)    Agent: Please be advised that RX refills may take up to 3 business days. We ask that you follow-up with your pharmacy.

## 2018-02-01 ENCOUNTER — Ambulatory Visit: Payer: BLUE CROSS/BLUE SHIELD | Admitting: Family Medicine

## 2018-02-03 ENCOUNTER — Encounter: Payer: Self-pay | Admitting: Family Medicine

## 2018-02-03 ENCOUNTER — Ambulatory Visit (INDEPENDENT_AMBULATORY_CARE_PROVIDER_SITE_OTHER): Payer: BLUE CROSS/BLUE SHIELD | Admitting: Family Medicine

## 2018-02-03 VITALS — BP 116/80 | HR 86 | Temp 98.6°F | Resp 16 | Ht 76.0 in | Wt 247.0 lb

## 2018-02-03 DIAGNOSIS — E538 Deficiency of other specified B group vitamins: Secondary | ICD-10-CM

## 2018-02-03 DIAGNOSIS — K219 Gastro-esophageal reflux disease without esophagitis: Secondary | ICD-10-CM | POA: Diagnosis not present

## 2018-02-03 DIAGNOSIS — E559 Vitamin D deficiency, unspecified: Secondary | ICD-10-CM

## 2018-02-03 DIAGNOSIS — Z79899 Other long term (current) drug therapy: Secondary | ICD-10-CM

## 2018-02-03 MED ORDER — OMEPRAZOLE 40 MG PO CPDR
40.0000 mg | DELAYED_RELEASE_CAPSULE | Freq: Every day | ORAL | 0 refills | Status: DC
Start: 2018-02-03 — End: 2018-06-20

## 2018-02-03 MED ORDER — VITAMIN D3 25 MCG (1000 UT) PO CAPS: 1000.0000 [IU] | ORAL_CAPSULE | Freq: Every day | ORAL | 3 refills | Status: DC

## 2018-02-03 NOTE — Patient Instructions (Signed)
Barrett Esophagus  Barrett esophagus occurs when the tissue that lines the esophagus changes or becomes damaged. The esophagus is the tube that carries food from the throat to the stomach. With Barrett esophagus, the cells that line the esophagus are replaced by cells that are similar to the lining of the intestines (intestinal metaplasia).  Barrett esophagus itself may not cause any symptoms. However, many people who have Barrett esophagus also have gastroesophageal reflux disease (GERD), which may cause symptoms such as heartburn. Treatment may include medicines, procedures to destroy the abnormal cells, or surgery. Over time, a few people with this condition may develop cancer of the esophagus.  What are the causes?  The exact cause of this condition is not known. In some cases, the condition develops from damage to the lining of the esophagus caused by GERD. GERD occurs when stomach acids flow up from the stomach into the esophagus. Frequent symptoms of GERD may cause intestinal metaplasia or cause cell changes (dysplasia).  What increases the risk?  The following factors may make you more likely to develop this condition:   Having GERD.   Being any of the following:  ? Male.  ? White (Caucasian).  ? Obese.  ? Older than 50.   Having a hiatal hernia.   Smoking.    What are the signs or symptoms?  People with Barrett esophagus often have no symptoms. However, many people with this condition also have GERD. Symptoms of GERD may include:   Heartburn.   Difficulty swallowing.   Dry cough.    How is this diagnosed?  Barrett esophagus may be diagnosed with an exam called an upper gastrointestinal endoscopy. During this exam, a thin, flexible tube (endoscope) is passed down your esophagus. The endoscope has a light and camera on the end of it. Your health care provider uses the endoscope to view the inside of your esophagus. During the exam, several tissue samples will be removed (biopsy) from your esophagus so  they can be checked for intestinal metaplasia or dysplasia.  How is this treated?  Treatment for this condition may include:   Medicines (proton pump inhibitors, or PPIs) to decrease or stop GERD.   Periodic endoscopic exams to make sure that cancer is not developing.   A procedure or surgery for dysplasia. This may include:  ? Endoscopic removal or destruction of abnormal cells.  ? Removal of part of the esophagus (esophagectomy).    Follow these instructions at home:  Eating and drinking   Eat more fruits and vegetables.   Avoid fatty foods.   Eat small, frequent meals instead of large meals.   Avoid foods that cause heartburn. These foods include:  ? Coffee and alcoholic drinks.  ? Tomatoes and foods made with tomatoes.  ? Greasy or spicy foods.  ? Chocolate and peppermint.  General instructions     Take over-the-counter and prescription medicines only as told by your health care provider.   Do not use any tobacco products, such as cigarettes, chewing tobacco, and e-cigarettes. If you need help quitting, ask your health care provider.   If your health care provider is treating you for GERD, make sure you follow all instructions and take medicines as directed.   Keep all follow-up visits as told by your health care provider. This is important.  Contact a health care provider if:   You have heartburn or GERD symptoms.   You have difficulty swallowing.  Get help right away if:   You have chest   pain.   You are unable to swallow.   You vomit blood or material that looks like coffee grounds.   Your stool (feces) is bright red or dark.  This information is not intended to replace advice given to you by your health care provider. Make sure you discuss any questions you have with your health care provider.  Document Released: 05/01/2003 Document Revised: 07/17/2015 Document Reviewed: 11/21/2014  Elsevier Interactive Patient Education  2018 Elsevier Inc.

## 2018-02-03 NOTE — Progress Notes (Signed)
Andres Brooks , 02-Mar-1975, 42 y.o., male MRN: [id number] Patient Care Team    Relationship Specialty Notifications Start End  [name], DO PCP - General Family Medicine  03/03/15     Chief Complaint  Patient presents with  . Follow-up    [facility]     Subjective:  Pt presents for follow up GERD. He reports he has started taking every other day omeprazole after last visit. He does well with this dosing, but does need to take daily with flares on occassions. He has had low vit d and b12, he is not taking supplements as suggested. He did for a while and then forgot. He has had chronic GERD and PPI therapy since he was 16. His last EGD was > 20 years ago.   Prior note:  Pt presents for an OV with complaints of GERD. He reports h/o reflux since he was a teenager. He has been a form of PPI > 20 years. He recently ran out of omeprazole 40 mg QD dose, provider by another provider and is asking for refills today. He reports an EGD was completed when he was teenager, but has had no GI follow since. He has never identified a specific trigger. He sometimes can taper back to every other day dosing, but most days needs the 40 mg a day. He has tried to go a few days without medication and his symptoms become severe. He reports his grandmother also had severe GERD. He does have h/o vit d deficiency and B12 deficiency, which is is taking daily supplements. He takes a multi-vitamin but is uncertain if it contains magnesium.   Depression screen [facility] 2/9 03/29/2017 05/02/2015  Decreased Interest 0 0  Down, Depressed, Hopeless 0 0  PHQ - 2 Score 0 0    Allergies  Allergen Reactions  . Ciprofloxacin Other (See Comments)    Elevated creatinine   Social History   Tobacco Use  . Smoking status: Never Smoker  . Smokeless tobacco: Never Used  Substance Use Topics  . Alcohol use: Yes    Alcohol/week: 2.0 standard drinks    Types: 2 Glasses of wine per week   Past Medical History:  Diagnosis Date  . GERD  (gastroesophageal reflux disease)   . Thyroid disease    Past Surgical History:  Procedure Laterality Date  . COLONOSCOPY     no polyps  . VASECTOMY     Family History  Problem Relation Age of Onset  . Breast cancer Mother   . Hypertension Mother   . Arthritis Mother   . Skin cancer Brother   . Breast cancer Maternal Aunt   . Heart disease Maternal Uncle 33       33    Allergies as of 02/03/2018      Reactions   Ciprofloxacin Other (See Comments)   Elevated creatinine      Medication List       Accurate as of February 03, 2018  9:06 AM. Always use your most recent med list.        levothyroxine 175 MCG tablet Commonly known as:  SYNTHROID, LEVOTHROID Take 175 mcg by mouth daily before breakfast.   omeprazole 40 MG capsule Commonly known as:  PRILOSEC Take 1 capsule (40 mg total) by mouth daily. Patient needs office visit prior to anymore refills.       All past medical history, surgical history, allergies, family history, immunizations andmedications were updated in the EMR today and reviewed under the  history and medication portions of their EMR.     ROS: Negative, with the exception of above mentioned in HPI   Objective:  BP 116/80 (BP Location: Left Arm, Patient Position: Sitting, Cuff Size: Large)   Pulse 86   Temp 98.6 F (37 C) (Oral)   Resp 16   Ht 6\' 4"  (1.93 m)   Wt 247 lb (112 kg)   SpO2 96%   BMI 30.07 kg/m  Body mass index is 30.07 kg/m. Gen: Afebrile. No acute distress. Overweight HENT: AT. [place]. MMM.  Eyes:Pupils Equal Round Reactive to light, Extraocular movements intact,  Conjunctiva without redness, discharge or icterus. Neck/lymp/endocrine: Supple,no lymphadenopathy CV: RRR, no edema Chest: CTAB, no wheeze or crackles Abd: Soft. NTND. BS present. no Masses palpated.  Neuro:  Normal gait. PERLA. EOMi. Alert. Oriented.  No exam data present No results found. No results found for this or any previous visit (from the past 24  hour(s)).  Assessment/Plan: Avel Sensorndrew Mullens is a 42 y.o. male present for OV for  Gastroesophageal reflux disease without esophagitis Current use of proton pump inhibitor - discussed long term use of PPI again today. Lowest possible dose recommended. He is usually taking every other day, unless flare he returns to daily.  - > 20 year h/o use of PPI.  - refills on omeprazole for 1 year. Discussed restarting his B12 and vit d- has known deficiencies and not taking the supplements.  - counseled on avoiding triggers.  - GI referral for recs on repeat EGD given > 20 years on PPI for chronic GERD and last EGD > 20 years ago. He is agreeable. Discussed barrett's esophagus potential with long term GERD history.    Reviewed expectations re: course of current medical issues.  Discussed self-management of symptoms.  Outlined signs and symptoms indicating need for more acute intervention.  Patient verbalized understanding and all questions were answered.  Patient received an After-Visit Summary.   Note is dictated utilizing voice recognition software. Although note has been proof read prior to signing, occasional typographical errors still can be missed. If any questions arise, please do not hesitate to call for verification.   electronically signed by:  Felix Pacinienee Yatzari Jonsson, DO  [facility] - [place]

## 2018-02-27 ENCOUNTER — Encounter: Payer: Self-pay | Admitting: Gastroenterology

## 2018-03-10 ENCOUNTER — Encounter: Payer: Self-pay | Admitting: Gastroenterology

## 2018-03-10 ENCOUNTER — Ambulatory Visit (INDEPENDENT_AMBULATORY_CARE_PROVIDER_SITE_OTHER): Payer: BLUE CROSS/BLUE SHIELD | Admitting: Gastroenterology

## 2018-03-10 VITALS — BP 126/78 | HR 73 | Ht 76.0 in | Wt 237.0 lb

## 2018-03-10 DIAGNOSIS — K219 Gastro-esophageal reflux disease without esophagitis: Secondary | ICD-10-CM | POA: Diagnosis not present

## 2018-03-10 DIAGNOSIS — R6889 Other general symptoms and signs: Secondary | ICD-10-CM

## 2018-03-10 DIAGNOSIS — R0989 Other specified symptoms and signs involving the circulatory and respiratory systems: Secondary | ICD-10-CM

## 2018-03-10 MED ORDER — OMEPRAZOLE 40 MG PO CPDR: 40.0000 mg | DELAYED_RELEASE_CAPSULE | Freq: Two times a day (BID) | ORAL | 3 refills | Status: DC

## 2018-03-10 NOTE — Progress Notes (Signed)
GASTROENTEROLOGY OUTPATIENT CLINIC VISIT   Primary Care Provider Andres Leatherwood, DO [address] [place] [address] [phone]  Referring Provider Andres Leatherwood, DO [address] [place], [place] [address] [phone]  Patient Profile: Andres Brooks is a 43 y.o. male with a pmh significant for GERD, hypothyroidism.  The patient presents to the [facility] for an evaluation and management of problem(s) noted below:  Problem List 1. Gastroesophageal reflux disease, esophagitis presence not specified   2. Chronic throat clearing     History of Present Illness: This is the patient's first visit to the outpatient of our GI clinic.  He states he was diagnosed with GERD when he was 42 years old.  He has been on medication since that time.  He states the progression of his medications has gone from H2 receptor antagonist to eventually Protonix -> Nexium -> Omeprazole.  He has been on omeprazole for many years now.  Over the course the last 3 to 4 years as he has been dealing with thyroid issues he had thicker secretions and phlegm production.  He has been coughing and gagging at times mostly in the morning.  He denies overt dysphagia.  If he misses any doses of his acid suppression medication he will have acid reflux changes and sensations.  He has no odynophagia.  He underwent a colonoscopy a few years ago for changes in bowel habits and that was done in [place] and he was told that things were normal we will work on trying to get those records to [place].  He does not take any significant nonsteroidals or BC/Goody powders.  The patient has not had an endoscopy since the age of 19.  GI Review of Systems Positive as above Negative for epigastric pain, nausea, vomiting, hematemesis, jaundice, early satiety, bloating, change in bowel habits, melena, hematochezia  Review of Systems General: Denies fevers/chills/weight loss HEENT: Denies oral lesions Cardiovascular: Denies  chest pain/palpitations Pulmonary: Denies shortness of breath Gastroenterological: See HPI Genitourinary: Denies darkened urine Hematological: Denies easy bruising/bleeding  Dermatological: Denies jaundice Psychological: Mood is stable   Medications Current Outpatient Medications  Medication Sig Dispense Refill  . Cholecalciferol (VITAMIN D3) 25 MCG (1000 UT) CAPS Take 1 capsule (1,000 Units total) by mouth daily. 90 capsule 3  . levothyroxine (SYNTHROID, LEVOTHROID) 175 MCG tablet Take 175 mcg by mouth daily before breakfast.    . omeprazole (PRILOSEC) 40 MG capsule Take 1 capsule (40 mg total) by mouth daily. Patient needs office visit prior to anymore refills. 90 capsule 0  . omeprazole (PRILOSEC) 40 MG capsule Take 1 capsule (40 mg total) by mouth 2 (two) times daily. 60 capsule 3   No current facility-administered medications for this visit.     Allergies Allergies  Allergen Reactions  . Ciprofloxacin Other (See Comments)    Elevated creatinine    Histories Past Medical History:  Diagnosis Date  . GERD (gastroesophageal reflux disease)   . Thyroid disease    Past Surgical History:  Procedure Laterality Date  . COLONOSCOPY     no polyps  . VASECTOMY     Social History   Socioeconomic History  . Marital status: Married    Spouse name: Not on file  . Number of children: Not on file  . Years of education: Not on file  . Highest education level: Not on file  Occupational History  . Not on file  Social Needs  . Financial resource strain: Not on file  .  Food insecurity:    Worry: Not on file    Inability: Not on file  . Transportation needs:    Medical: Not on file    Non-medical: Not on file  Tobacco Use  . Smoking status: Never Smoker  . Smokeless tobacco: Never Used  Substance and Sexual Activity  . Alcohol use: Yes    Alcohol/week: 2.0 standard drinks    Types: 2 Glasses of wine per week  . Drug use: No  . Sexual activity: Yes  Lifestyle  .  Physical activity:    Days per week: Not on file    Minutes per session: Not on file  . Stress: Not on file  Relationships  . Social connections:    Talks on phone: Not on file    Gets together: Not on file    Attends religious service: Not on file    Active member of club or organization: Not on file    Attends meetings of clubs or organizations: Not on file    Relationship status: Not on file  . Intimate partner violence:    Fear of current or ex partner: Not on file    Emotionally abused: Not on file    Physically abused: Not on file    Forced sexual activity: Not on file  Other Topics Concern  . Not on file  Social History Narrative   Married, Andres Brooks.   Has two daughters, Andres Brooks and Andres Brooks.   [employer]. College education.   Drink caffeinated beverages. Uses herbal remedies. Takes a daily vitamin.   Wears his seatbelt, wears a bicycle helmet. Smoke alarm in the home. Firearms in the home.   Feels safe in his relationship.   Family History  Problem Relation Age of Onset  . Breast cancer Mother   . Hypertension Mother   . Arthritis Mother   . Skin cancer Brother   . Breast cancer Maternal Aunt   . Heart disease Maternal Uncle 33       33   . Colon cancer Neg Hx   . Esophageal cancer Neg Hx   . Inflammatory bowel disease Neg Hx   . Liver disease Neg Hx   . Pancreatic cancer Neg Hx   . Rectal cancer Neg Hx   . Stomach cancer Neg Hx    I have reviewed his medical, social, and family history in detail and updated the electronic medical record as necessary.    PHYSICAL EXAMINATION  BP 126/78   Pulse 73   Ht 6\' 4"  (1.93 m)   Wt 237 lb (107.5 kg)   SpO2 98%   BMI 28.85 kg/m  Wt Readings from Last 3 Encounters:  03/10/18 237 lb (107.5 kg)  02/03/18 247 lb (112 kg)  03/29/17 232 lb 12.8 oz (105.6 kg)  GEN: NAD, appears stated age, doesn't appear chronically ill PSYCH: Cooperative, without pressured speech EYE: Conjunctivae pink, sclerae anicteric ENT: MMM,  without oral ulcers, no erythema or exudates noted NECK: Supple CV: RR without R/Gs  RESP: CTAB posteriorly, without wheezing GI: NABS, soft, NT/ND, without rebound or guarding, no HSM appreciated MSK/EXT: No lower extremity edema SKIN: No jaundice NEURO:  Alert & Oriented x 3, no focal deficits   REVIEW OF DATA  I reviewed the following data at the time of this encounter:  GI Procedures and Studies  Patient states he had an EGD at the age of 43 but does not have those records He has not been seen by any other GI docs  Laboratory Studies  Reviewed in epic  Imaging Studies  No relevant studies to review   ASSESSMENT  Mr. Hur is a 43 y.o. male with a pmh significant for GERD, hypothyroidism.  The patient is seen today for evaluation and management of:  1. Gastroesophageal reflux disease, esophagitis presence not specified   2. Chronic throat clearing    The patient is hemodynamically stable and has longstanding history of chronic GERD.  He has had some other symptoms which he has been concerned about could be atypical GERD regards to hoarseness of his voice as well as throat clearing and mucus production.  He does not describe a history of being on long-term high-dose acid suppression medication but has not been on acid production medication since the age of 28.  He states he is never looked into other management therapies for GERD including fundoplication or endoscopic fundoplication.  We discussed about this briefly.  For now I would like the patient to go ahead and increase his acid suppression medication to 40 mg twice daily as a high-dose treatment/diagnostic test.  We will plan on likely doing in endoscopy as a diagnostic procedure in 6 to 8 weeks based on his symptoms.  We talked briefly about the role of pH impedance testing for patients.  I do not think we need to do that currently but he may be a candidate for someone for [place] to ensure that his symptoms are truly acid related if  he does not have evidence of Barrett's esophagus or esophagitis or eosinophilic/lymphocytic esophagitis.  The risks and benefits of endoscopic evaluation were discussed with the patient; these include but are not limited to the risk of perforation, infection, bleeding, missed lesions, lack of diagnosis, severe illness requiring hospitalization, as well as anesthesia and sedation related illnesses.  The patient is agreeable to proceed.  All patient questions were answered, to the best of my ability, and the patient agrees to the aforementioned plan of action with follow-up as indicated.   PLAN  40 mg twice daily PPI for next 4 8 weeks If patient has persistent symptoms typical or atypical we will plan to proceed with a diagnostic endoscopy Further work-up and management to be dictated based on findings of this evaluation above The patient may benefit in the future from pH impedance testing and discussions further about endoscopic therapies for GERD as well as possible surgical evaluation for fundoplication   No orders of the defined types were placed in this encounter.   New Prescriptions   OMEPRAZOLE (PRILOSEC) 40 MG CAPSULE    Take 1 capsule (40 mg total) by mouth 2 (two) times daily.   Modified Medications   No medications on file    Planned Follow Up: No follow-ups on file.   Corliss Parish, MD [facility] Office # [phone]

## 2018-03-10 NOTE — Patient Instructions (Signed)
We have sent the following medications to your pharmacy for you to pick up at your convenience:  We will contact you to schedule your endoscopy procedure  Thank you for entrusting me with your care and choosing [facility].  Dr Meridee Score

## 2018-03-11 ENCOUNTER — Encounter: Payer: Self-pay | Admitting: Gastroenterology

## 2018-03-11 NOTE — Progress Notes (Signed)
Documentation of outside records These will be scanned into the chart  06/01/2012 pathology Terminal ileum biopsies no pathologic diagnosis Colon random biopsies show colonic mucosa with no pathologic diagnosis negative for colitis  Celiac serologies from 4/14 Delaminated gliadin IgA negative D emanated gliadin IgG negative TTG IgA negative TTG IgG negative Endomysial antibody IgA negative IgA level normal  4/14 colonoscopy Indication diarrhea/change in bowel habits Mucosa showed mild erythema in the terminal ileum.  Multiple biopsies were performed. Normal mucosa was noted in the whole colon.  Multiple biopsies were performed.  05/17/2012 clinic visit at digestive health specialist Plan was for a diagnostic colonoscopy and to continue fiber and probiotic

## 2018-03-13 ENCOUNTER — Encounter: Payer: Self-pay | Admitting: Gastroenterology

## 2018-03-13 DIAGNOSIS — R0989 Other specified symptoms and signs involving the circulatory and respiratory systems: Secondary | ICD-10-CM

## 2018-03-13 DIAGNOSIS — R6889 Other general symptoms and signs: Secondary | ICD-10-CM | POA: Insufficient documentation

## 2018-03-13 HISTORY — DX: Other specified symptoms and signs involving the circulatory and respiratory systems: R09.89

## 2018-04-10 ENCOUNTER — Encounter: Payer: Self-pay | Admitting: Gastroenterology

## 2018-04-14 ENCOUNTER — Ambulatory Visit (AMBULATORY_SURGERY_CENTER): Payer: Self-pay | Admitting: *Deleted

## 2018-04-14 ENCOUNTER — Other Ambulatory Visit: Payer: Self-pay

## 2018-04-14 VITALS — Ht 76.0 in | Wt 232.0 lb

## 2018-04-14 DIAGNOSIS — K219 Gastro-esophageal reflux disease without esophagitis: Secondary | ICD-10-CM

## 2018-04-14 NOTE — Progress Notes (Signed)
No egg or soy allergy known to patient  No issues with past sedation with any surgeries  or procedures, no intubation problems  No diet pills per patient No home 02 use per patient  No blood thinners per patient  No A fib or A flutter  EMMI video sent to pt's e mail  Patient requesting a sooner appointment. Procedure changed to Thursday, 05/04/18 at 4 pm

## 2018-04-20 ENCOUNTER — Encounter: Payer: Self-pay | Admitting: Gastroenterology

## 2018-05-04 ENCOUNTER — Encounter: Payer: Self-pay | Admitting: Gastroenterology

## 2018-05-04 ENCOUNTER — Ambulatory Visit (AMBULATORY_SURGERY_CENTER): Payer: BLUE CROSS/BLUE SHIELD | Admitting: Gastroenterology

## 2018-05-04 ENCOUNTER — Other Ambulatory Visit: Payer: Self-pay

## 2018-05-04 VITALS — BP 92/50 | HR 80 | Temp 98.0°F | Resp 14 | Ht 76.0 in | Wt 232.0 lb

## 2018-05-04 DIAGNOSIS — K2970 Gastritis, unspecified, without bleeding: Secondary | ICD-10-CM

## 2018-05-04 DIAGNOSIS — K219 Gastro-esophageal reflux disease without esophagitis: Secondary | ICD-10-CM

## 2018-05-04 DIAGNOSIS — K3189 Other diseases of stomach and duodenum: Secondary | ICD-10-CM

## 2018-05-04 DIAGNOSIS — K317 Polyp of stomach and duodenum: Secondary | ICD-10-CM

## 2018-05-04 MED ORDER — SODIUM CHLORIDE 0.9 % IV SOLN: 500.0000 mL | Freq: Once | INTRAVENOUS | Status: DC

## 2018-05-04 NOTE — Op Note (Signed)
[place] Patient Name: Andres Brooks Procedure Date: 05/04/2018 4:02 PM MRN: [id number] Endoscopist: Justice Britain , MD Age: 43 Referring MD:  Date of Birth: 04/20/1975 Gender: Male Account #: [id number] Procedure:                Upper GI endoscopy Indications:              Gastro-esophageal reflux disease, Esophageal reflux                            symptoms that persist despite appropriate therapy Medicines:                Monitored Anesthesia Care Procedure:                Pre-Anesthesia Assessment:                           - Prior to the procedure, a History and Physical                            was performed, and patient medications and                            allergies were reviewed. The patient's tolerance of                            previous anesthesia was also reviewed. The risks                            and benefits of the procedure and the sedation                            options and risks were discussed with the patient.                            All questions were answered, and informed consent                            was obtained. Prior Anticoagulants: The patient has                            taken no previous anticoagulant or antiplatelet                            agents. ASA Grade Assessment: II - A patient with                            mild systemic disease. After reviewing the risks                            and benefits, the patient was deemed in                            satisfactory condition to undergo the procedure.  After obtaining informed consent, the endoscope was                            passed under direct vision. Throughout the                            procedure, the patient's blood pressure, pulse, and                            oxygen saturations were monitored continuously. The                            Model GIF-HQ190 [id number]) scope was introduced   through the mouth, and advanced to the second part                            of duodenum. The upper GI endoscopy was                            accomplished without difficulty. The patient                            tolerated the procedure. Scope In: Scope Out: Findings:                 No gross lesions were noted in the entire                            esophagus. Biopsies were taken with a cold forceps                            for histology. Biopsies were taken with a cold                            forceps for histology.                           The Z-line was irregular and was found 43 cm from                            the incisors.                           Patchy mildly erythematous mucosa without bleeding                            was found in the gastric body and in the gastric                            antrum.                           No gross lesions were noted in the entire examined  stomach. Biopsies were taken with a cold forceps                            for histology and Helicobacter pylori testing.                           Multiple small semi-sessile polyps with no bleeding                            and no stigmata of recent bleeding were found in                            the gastric fundus and in the gastric body.                            Biopsies were taken with a cold forceps for                            histology from a few polyps - have appearance of                            fundic gland polyps.                           No gross lesions were noted in the duodenal bulb,                            in the first portion of the duodenum and in the                            second portion of the duodenum. Complications:            No immediate complications. Estimated Blood Loss:     Estimated blood loss was minimal. Impression:               - No gross lesions in esophagus. Biopsied.                           - Z-line  irregular, 43 cm from the incisors.                           - Erythematous mucosa in the gastric body and                            antrum.                           - No gross lesions in the stomach. Biopsied.                           - Multiple gastric polyps - likely fundic gland                            polyps. Biopsied.                           -  No gross lesions in the duodenal bulb, in the                            first portion of the duodenum and in the second                            portion of the duodenum. Recommendation:           - The patient will be observed post-procedure,                            until all discharge criteria are met.                           - Discharge patient to home.                           - Patient has a contact number available for                            emergencies. The signs and symptoms of potential                            delayed complications were discussed with the                            patient. Return to normal activities tomorrow.                            Written discharge instructions were provided to the                            patient.                           - Resume previous diet.                           - Continue present medications.                           - Await pathology results.                           - Continue present medications.                           - After results return will have patient return to                            clinic to discuss further evaluation/management                            options to include pH Impedence/Manometry  evaluation before potential Surgical or TIF                            referral.                           - The findings and recommendations were discussed                            with the patient.                           - The findings and recommendations were discussed                            with the  patient's family. Justice Britain, MD 05/04/2018 4:40:25 PM

## 2018-05-04 NOTE — Progress Notes (Signed)
PT taken to PACU. Monitors in place. VSS. Report given to RN. 

## 2018-05-04 NOTE — Progress Notes (Signed)
Pt's states no medical or surgical changes since previsit or office visit. 

## 2018-05-04 NOTE — Patient Instructions (Signed)
Continue present medications. Await pathology results.     YOU HAD AN ENDOSCOPIC PROCEDURE TODAY AT THE [facility]:   Refer to the procedure report that was given to you for any specific questions about what was found during the examination.  If the procedure report does not answer your questions, please call your gastroenterologist to clarify.  If you requested that your care partner not be given the details of your procedure findings, then the procedure report has been included in a sealed envelope for you to review at your convenience later.  YOU SHOULD EXPECT: Some feelings of bloating in the abdomen. Passage of more gas than usual.  Walking can help get rid of the air that was put into your GI tract during the procedure and reduce the bloating. If you had a lower endoscopy (such as a colonoscopy or flexible sigmoidoscopy) you may notice spotting of blood in your stool or on the toilet paper. If you underwent a bowel prep for your procedure, you may not have a normal bowel movement for a few days.  Please Note:  You might notice some irritation and congestion in your nose or some drainage.  This is from the oxygen used during your procedure.  There is no need for concern and it should clear up in a day or so.  SYMPTOMS TO REPORT IMMEDIATELY:    Following upper endoscopy (EGD)  Vomiting of blood or coffee ground material  New chest pain or pain under the shoulder blades  Painful or persistently difficult swallowing  New shortness of breath  Fever of 100F or higher  Black, tarry-looking stools  For urgent or emergent issues, a gastroenterologist can be reached at any hour by calling [phone].   DIET:  We do recommend a small meal at first, but then you may proceed to your regular diet.  Drink plenty of fluids but you should avoid alcoholic beverages for 24 hours.  ACTIVITY:  You should plan to take it easy for the rest of today and you should NOT DRIVE or use heavy  machinery until tomorrow (because of the sedation medicines used during the test).    FOLLOW UP: Our staff will call the number listed on your records the next business day following your procedure to check on you and address any questions or concerns that you may have regarding the information given to you following your procedure. If we do not reach you, we will leave a message.  However, if you are feeling well and you are not experiencing any problems, there is no need to return our call.  We will assume that you have returned to your regular daily activities without incident.  If any biopsies were taken you will be contacted by phone or by letter within the next 1-3 weeks.  Please call us at [phone] if you have not heard about the biopsies in 3 weeks.    SIGNATURES/CONFIDENTIALITY: You and/or your care partner have signed paperwork which will be entered into your electronic medical record.  These signatures attest to the fact that that the information above on your After Visit Summary has been reviewed and is understood.  Full responsibility of the confidentiality of this discharge information lies with you and/or your care-partner. 

## 2018-05-05 ENCOUNTER — Telehealth: Payer: Self-pay

## 2018-05-05 NOTE — Telephone Encounter (Signed)
  Follow up Call-  Call back number 05/04/2018  Post procedure Call Back phone  # [phone]  Permission to leave phone message Yes  Some recent data might be hidden     Patient questions:  Do you have a fever, pain , or abdominal swelling? No. Pain Score  0 *  Have you tolerated food without any problems? Yes.    Have you been able to return to your normal activities? Yes.    Do you have any questions about your discharge instructions: Diet   No. Medications  No. Follow up visit  No.  Do you have questions or concerns about your Care? No.  Actions: * If pain score is 4 or above: No action needed, pain <4.

## 2018-05-14 ENCOUNTER — Encounter: Payer: Self-pay | Admitting: Gastroenterology

## 2018-05-15 ENCOUNTER — Telehealth: Payer: Self-pay

## 2018-05-15 NOTE — Telephone Encounter (Signed)
appt made with Dr Meridee Score on 06/20/18 at 930 am

## 2018-05-15 NOTE — Telephone Encounter (Signed)
The patient has been notified of this information and all questions answered.

## 2018-05-15 NOTE — Telephone Encounter (Signed)
-----   Message from Lemar Lofty., MD sent at 05/14/2018  3:05 PM EDT ----- Regarding: Follow-up Andres Brooks, please schedule a follow-up in clinic versus telehealth follow-up in approximately 3 to 6 weeks.Thank you.GM

## 2018-05-26 ENCOUNTER — Encounter: Payer: BLUE CROSS/BLUE SHIELD | Admitting: Gastroenterology

## 2018-06-19 ENCOUNTER — Other Ambulatory Visit: Payer: Self-pay

## 2018-06-20 ENCOUNTER — Ambulatory Visit: Payer: BLUE CROSS/BLUE SHIELD | Admitting: Gastroenterology

## 2018-06-20 ENCOUNTER — Other Ambulatory Visit: Payer: Self-pay

## 2018-06-20 DIAGNOSIS — K219 Gastro-esophageal reflux disease without esophagitis: Secondary | ICD-10-CM

## 2018-06-20 DIAGNOSIS — R6889 Other general symptoms and signs: Secondary | ICD-10-CM | POA: Diagnosis not present

## 2018-06-20 DIAGNOSIS — R0989 Other specified symptoms and signs involving the circulatory and respiratory systems: Secondary | ICD-10-CM

## 2018-06-20 MED ORDER — OMEPRAZOLE 40 MG PO CPDR: 40.0000 mg | DELAYED_RELEASE_CAPSULE | Freq: Every day | ORAL | 4 refills | Status: DC

## 2018-06-20 NOTE — Progress Notes (Signed)
GASTROENTEROLOGY OUTPATIENT CLINIC VISIT   Primary Care Provider [name], [place] [address] [place] [address] [phone]  Patient Profile: Andres Brooks is a 43 y.o. male with a pmh significant for GERD, hypothyroidism.  The patient presents to the [facility] for an evaluation and management of problem(s) noted below:  Problem List 1. Gastroesophageal reflux disease, esophagitis presence not specified   2. Chronic throat clearing     History of Present Illness: Please see initial consultation note for full details of HPI.    I connected with  Andres Brooks on 06/21/18. I verified that I was speaking with the correct person using two identifiers. Due to the COVID-19 Pandemic, this service was provided via telemedicine using audiovisual media. The patient was located at home. The provider was located in the office. The patient did consent to this visit and is aware of charges through their insurance as well as the limitations of evaluation and management by telemedicine. Other persons participating in this telemedicine service were none.  Interval History The patient has had no significant changes since his upper endoscopy was performed.  He states that he is willing to stay on his current dose of medication as it controls his symptoms.  He is already self titrated down to once daily.  He knows that if he uses the medication on an every other day basis he feels that symptoms are well controlled.  He is not having any overt dysphagia or odynophagia currently.  His main concern is that his daughter is beginning to show symptoms that would be suggestive of dyspepsia/GERD symptoms and she is not even a teenager yet.  He continues lifestyle modifications as previously discussed.  GI Review of Systems Positive as above Negative for abdominal pain, nausea, vomiting, bloating, dyspepsia, melena, hematochezia   Review of Systems General: Denies  fevers/chills/weight loss Cardiovascular: Denies chest pain Pulmonary: Denies shortness of breath Gastroenterological: See HPI Genitourinary: Denies darkened urine Hematological: Denies easy bruising Dermatological: Denies jaundice Psychological: Mood is stable   Medications Current Outpatient Medications  Medication Sig Dispense Refill  . Cholecalciferol (VITAMIN D3) 25 MCG (1000 UT) CAPS Take 1 capsule (1,000 Units total) by mouth daily. 90 capsule 3  . levothyroxine (SYNTHROID, LEVOTHROID) 175 MCG tablet Take 175 mcg by mouth daily before breakfast.    . omeprazole (PRILOSEC) 40 MG capsule Take 1 capsule (40 mg total) by mouth daily. Patient needs office visit prior to anymore refills. 90 capsule 4  . vitamin C (ASCORBIC ACID) 500 MG tablet Take 500 mg by mouth daily.     No current facility-administered medications for this visit.     Allergies Allergies  Allergen Reactions  . Ciprofloxacin Other (See Comments)    Elevated creatinine    Histories Past Medical History:  Diagnosis Date  . GERD (gastroesophageal reflux disease)   . Thyroid disease    Past Surgical History:  Procedure Laterality Date  . COLONOSCOPY     no polyps  . UPPER GASTROINTESTINAL ENDOSCOPY    . VASECTOMY     Social History   Socioeconomic History  . Marital status: Married    Spouse name: Not on file  . Number of children: Not on file  . Years of education: Not on file  . Highest education level: Not on file  Occupational History  . Not on file  Social Needs  . Financial resource strain: Not on file  . Food insecurity:    Worry: Not on file  Inability: Not on file  . Transportation needs:    Medical: Not on file    Non-medical: Not on file  Tobacco Use  . Smoking status: Never Smoker  . Smokeless tobacco: Never Used  Substance and Sexual Activity  . Alcohol use: Yes    Alcohol/week: 2.0 standard drinks    Types: 2 Glasses of wine per week  . Drug use: No  . Sexual activity:  Yes  Lifestyle  . Physical activity:    Days per week: Not on file    Minutes per session: Not on file  . Stress: Not on file  Relationships  . Social connections:    Talks on phone: Not on file    Gets together: Not on file    Attends religious service: Not on file    Active member of club or organization: Not on file    Attends meetings of clubs or organizations: Not on file    Relationship status: Not on file  . Intimate partner violence:    Fear of current or ex partner: Not on file    Emotionally abused: Not on file    Physically abused: Not on file    Forced sexual activity: Not on file  Other Topics Concern  . Not on file  Social History Narrative   Married, AromasStephanie.   Has two daughters, Lisette AbuKaylee and Toy CareKarrington.   [employer]. College education.   Drink caffeinated beverages. Uses herbal remedies. Takes a daily vitamin.   Wears his seatbelt, wears a bicycle helmet. Smoke alarm in the home. Firearms in the home.   Feels safe in his relationship.   Family History  Problem Relation Age of Onset  . Breast cancer Mother   . Hypertension Mother   . Arthritis Mother   . Skin cancer Brother   . Breast cancer Maternal Aunt   . Heart disease Maternal Uncle 33       33   . Colon cancer Neg Hx   . Esophageal cancer Neg Hx   . Inflammatory bowel disease Neg Hx   . Liver disease Neg Hx   . Pancreatic cancer Neg Hx   . Rectal cancer Neg Hx   . Stomach cancer Neg Hx   . Colon polyps Neg Hx    I have reviewed his medical, social, and family history in detail and updated the electronic medical record as necessary.    PHYSICAL EXAMINATION  Telehealth visit    REVIEW OF DATA  I reviewed the following data at the time of this encounter:  GI Procedures and Studies  March 2020 EGD - No gross lesions in esophagus. Biopsied. - Z-line irregular, 43 cm from the incisors. - Erythematous mucosa in the gastric body and antrum. - No gross lesions in the stomach. Biopsied. - Multiple  gastric polyps - likely fundic gland polyps. Biopsied. - No gross lesions in the duodenal bulb, in the first portion of the duodenum and in the second portion of the duodenum. Diagnosis 1. Surgical [P], gastric - ANTRAL AND OXYNTIC MUCOSA WITH MILD HYPEREMIA. - WARTHIN-STARRY STAIN NEGATIVE FOR HELICOBACTER PYLORI. - NO INTESTINAL METAPLASIA, DYSPLASIA OR MALIGNANCY. 2. Surgical [P], gastric polyps - FUNDIC GLAND POLYPS. - WARTHIN-STARRY STAIN NEGATIVE FOR HELICOBACTER PYLORI.- - NO INTESTINAL METAPLASIA, ADENOMATOUS CHANGE OR MALIGNANCY. 3. Surgical [P], distal esophagus - SQUAMOUS MUCOSA WITH NO SIGNIFICANT PATHOLOGIC CHANGES. - PAS STAIN NEGATIVE FOR FUNGUS. - NO INTESTINAL METAPLASIA, DYSPLASIA OR MALIGNANCY. 4. Surgical [P], mid esophagus and proximal esophagus - SQUAMOUS MUCOSA WITH  NO SIGNIFICANT PATHOLOGIC CHANGES. - NO EOSINOPHILIC ESOPHAGITIS. - NO DYSPLASIA OR MALIGNANCY. - PAS STAIN NEGATIVE FOR FUNGUS.  Laboratory Studies  Reviewed in epic  Imaging Studies  No relevant studies to review   ASSESSMENT  Andres Brooks is a 43 y.o. male with a pmh significant for GERD, hypothyroidism.  The patient is seen today for evaluation and management of:  1. Gastroesophageal reflux disease, esophagitis presence not specified   2. Chronic throat clearing    The patient seems to be hemodynamically stable at this point in time.  Clinically he has no significant changes and is tolerating his oral PPI therapy.  We have ruled out significant concerns including Barrett's esophagus, esophageal adenocarcinoma, EOE, lymphocytic esophagitis, severe peptic ulcer disease.  At this point in time the patient feels well with taking daily or every other day PPI.  He is willing to continue taking medication and understands that there could be options for him in the future should he decide that he wants to come off of PPI therapy or consider surgical interventions which may include Nissen fundoplication  versus TIF versus [place].  We discussed briefly that we always try and titrated patient to the lowest dose that can allow symptom relief and if that is daily medication then we do that to try and minimize side effects in the long-term.  If he decides to continue on PPI therapy then I think repeating an endoscopy in 8 to 10 years as long as he is not having any other new development of symptoms is reasonable.  He will consider in the future if he wants to try and come off medications whether we should consider a surgical referral.  If other issues develop he will call to let us know but otherwise we will plan to see him on an as-needed basis.  Plan at age 31 for a surveillance endoscopy in the setting of longstanding heartburn as well as a screening colonoscopy.  All patient questions were answered, to the best of my ability, and the patient agrees to the aforementioned plan of action with follow-up as indicated.   PLAN  Continue 40 mg PPI daily and if able to down titrate to 20 mg over-the-counter daily then okay to continue this Holding on surgical evaluation for fundoplication or TIF or [employer] on pH impedance testing Repeat endoscopy in 10 years if no other symptoms at time of screening colonoscopy (will be age 86) Asked patient to discuss his concerns with his pediatrician or get a pediatric gastroenterologist for his daughter who is beginning to show early signs of GERD to ensure that she does not have EOE Refills for 1 year for PPI Follow-up in 1 year or earlier if necessary   No orders of the defined types were placed in this encounter.   New Prescriptions   No medications on file   Modified Medications   Modified Medication Previous Medication   OMEPRAZOLE (PRILOSEC) 40 MG CAPSULE omeprazole (PRILOSEC) 40 MG capsule      Take 1 capsule (40 mg total) by mouth daily. Patient needs office visit prior to anymore refills.    Take 1 capsule (40 mg total) by mouth daily. Patient needs  office visit prior to anymore refills.    Planned Follow Up: Return in about 1 year (around 06/20/2019).   Corliss Parish, MD [facility]oscopy Office # [phone]

## 2018-06-20 NOTE — Patient Instructions (Addendum)
If you are age 41 or older, your body mass index should be between 23-30. Your There is no height or weight on file to calculate BMI. If this is out of the aforementioned range listed, please consider follow up with your Primary Care Provider.  If you are age 109 or younger, your body mass index should be between 19-25. Your There is no height or weight on file to calculate BMI. If this is out of the aformentioned range listed, please consider follow up with your Primary Care Provider.    We have sent the following medications to your pharmacy for you to pick up at your convenience:  Omeprazole   We will contact you for a follow-up in one year.  Thank you for choosing me and [facility].  Dr. Meridee Score

## 2018-06-21 ENCOUNTER — Encounter: Payer: Self-pay | Admitting: Gastroenterology

## 2019-08-08 DIAGNOSIS — E039 Hypothyroidism, unspecified: Secondary | ICD-10-CM | POA: Diagnosis not present

## 2019-09-21 ENCOUNTER — Other Ambulatory Visit: Payer: Self-pay | Admitting: Gastroenterology

## 2019-09-21 DIAGNOSIS — K219 Gastro-esophageal reflux disease without esophagitis: Secondary | ICD-10-CM

## 2019-09-21 NOTE — Telephone Encounter (Signed)
Pt needs to schedule office follow-up to receive additional refill

## 2019-10-03 ENCOUNTER — Telehealth: Payer: Self-pay | Admitting: Family Medicine

## 2019-10-03 NOTE — Telephone Encounter (Signed)
Patient is calling in asking for a referral to have a sleep study, states wife is concerned about breathing at night.

## 2019-10-04 NOTE — Telephone Encounter (Signed)
Please advise this message 

## 2019-10-04 NOTE — Telephone Encounter (Signed)
Called lvm for patient to call back and schedule an appointment to be seen.

## 2019-10-04 NOTE — Telephone Encounter (Signed)
Patient last seen by this provider 01/2018.  Please have patient schedule an appointment.

## 2019-10-04 NOTE — Telephone Encounter (Signed)
Patient was returning a phone call, did advise that Dr.Kuneff would like to have patient come in before putting the referral in, scheduled for 8/24

## 2019-10-16 ENCOUNTER — Other Ambulatory Visit: Payer: Self-pay

## 2019-10-16 ENCOUNTER — Encounter: Payer: Self-pay | Admitting: Family Medicine

## 2019-10-16 ENCOUNTER — Ambulatory Visit (INDEPENDENT_AMBULATORY_CARE_PROVIDER_SITE_OTHER): Payer: BLUE CROSS/BLUE SHIELD | Admitting: Family Medicine

## 2019-10-16 VITALS — BP 126/84 | HR 94 | Temp 98.0°F | Ht 76.0 in | Wt 227.5 lb

## 2019-10-16 DIAGNOSIS — R0681 Apnea, not elsewhere classified: Secondary | ICD-10-CM | POA: Diagnosis not present

## 2019-10-16 DIAGNOSIS — F439 Reaction to severe stress, unspecified: Secondary | ICD-10-CM

## 2019-10-16 DIAGNOSIS — R400 Somnolence: Secondary | ICD-10-CM | POA: Diagnosis not present

## 2019-10-16 MED ORDER — ESCITALOPRAM OXALATE 5 MG PO TABS: 5.0000 mg | ORAL_TABLET | Freq: Every day | ORAL | 1 refills | Status: DC

## 2019-10-16 NOTE — Patient Instructions (Addendum)
Start lexapro daily. After 3 weeks if needed can increase to 2 tabs (same time) daily. You will need to call in if you increase dose so I can changed script.  Follow up 5.5 months on anxiety if doing well and not needed sooner   Neurology will call to set up appt to discuss sleep apnea.    Sleep Apnea Sleep apnea affects breathing during sleep. It causes breathing to stop for a short time or to become shallow. It can also increase the risk of:  Heart attack.  Stroke.  Being very overweight (obese).  Diabetes.  Heart failure.  Irregular heartbeat. The goal of treatment is to help you breathe normally again. What are the causes? There are three kinds of sleep apnea:  Obstructive sleep apnea. This is caused by a blocked or collapsed airway.  Central sleep apnea. This happens when the brain does not send the right signals to the muscles that control breathing.  Mixed sleep apnea. This is a combination of obstructive and central sleep apnea. The most common cause of this condition is a collapsed or blocked airway. This can happen if:  Your throat muscles are too relaxed.  Your tongue and tonsils are too large.  You are overweight.  Your airway is too small. What increases the risk?  Being overweight.  Smoking.  Having a small airway.  Being older.  Being male.  Drinking alcohol.  Taking medicines to calm yourself (sedatives or tranquilizers).  Having family members with the condition. What are the signs or symptoms?  Trouble staying asleep.  Being sleepy or tired during the day.  Getting angry a lot.  Loud snoring.  Headaches in the morning.  Not being able to focus your mind (concentrate).  Forgetting things.  Less interest in sex.  Mood swings.  Personality changes.  Feelings of sadness (depression).  Waking up a lot during the night to pee (urinate).  Dry mouth.  Sore throat. How is this diagnosed?  Your medical history.  A  physical exam.  A test that is done when you are sleeping (sleep study). The test is most often done in a sleep lab but may also be done at home. How is this treated?   Sleeping on your side.  Using a medicine to get rid of mucus in your nose (decongestant).  Avoiding the use of alcohol, medicines to help you relax, or certain pain medicines (narcotics).  Losing weight, if needed.  Changing your diet.  Not smoking.  Using a machine to open your airway while you sleep, such as: ? An oral appliance. This is a mouthpiece that shifts your lower jaw forward. ? A CPAP device. This device blows air through a mask when you breathe out (exhale). ? An EPAP device. This has valves that you put in each nostril. ? A BPAP device. This device blows air through a mask when you breathe in (inhale) and breathe out.  Having surgery if other treatments do not work. It is important to get treatment for sleep apnea. Without treatment, it can lead to:  High blood pressure.  Coronary artery disease.  In men, not being able to have an erection (impotence).  Reduced thinking ability. Follow these instructions at home: Lifestyle  Make changes that your doctor recommends.  Eat a healthy diet.  Lose weight if needed.  Avoid alcohol, medicines to help you relax, and some pain medicines.  Do not use any products that contain nicotine or tobacco, such as cigarettes, e-cigarettes, and  chewing tobacco. If you need help quitting, ask your doctor. General instructions  Take over-the-counter and prescription medicines only as told by your doctor.  If you were given a machine to use while you sleep, use it only as told by your doctor.  If you are having surgery, make sure to tell your doctor you have sleep apnea. You may need to bring your device with you.  Keep all follow-up visits as told by your doctor. This is important. Contact a doctor if:  The machine that you were given to use during sleep  bothers you or does not seem to be working.  You do not get better.  You get worse. Get help right away if:  Your chest hurts.  You have trouble breathing in enough air.  You have an uncomfortable feeling in your back, arms, or stomach.  You have trouble talking.  One side of your body feels weak.  A part of your face is hanging down. These symptoms may be an emergency. Do not wait to see if the symptoms will go away. Get medical help right away. Call your local emergency services (911 in the [place] Do not drive yourself to the hospital. Summary  This condition affects breathing during sleep.  The most common cause is a collapsed or blocked airway.  The goal of treatment is to help you breathe normally while you sleep. This information is not intended to replace advice given to you by your health care provider. Make sure you discuss any questions you have with your health care provider. Document Revised: 11/25/2017 Document Reviewed: 10/04/2017 Elsevier Patient Education  2020 [employer].

## 2019-10-16 NOTE — Progress Notes (Signed)
This visit occurred during the SARS-CoV-2 public health emergency.  Safety protocols were in place, including screening questions prior to the visit, additional usage of staff PPE, and extensive cleaning of exam room while observing appropriate contact time as indicated for disinfecting solutions.    Andres Brooks , Feb 20, 1976, 44 y.o., male MRN: [id number] Patient Care Team    Relationship Specialty Notifications Start End  [name], DO PCP - General Family Medicine  03/03/15     Chief Complaint  Patient presents with  . Referral     Subjective: Pt presents for an OV with complaints of snoring and anxiety  Snoring: Patient reports his wife has concerns over his snoring.  She feels he is gasping for breath at times in the middle of the night.Body mass index is 27.69 kg/m.  Male. 44 y.o. patient has some daytime fatigue.  He does not drink alcohol or take medications that cause sedation.  Stress: Patient reports he has been under a great deal of stress at work.  The pandemic has caused him to have decreased staffing and difficulty finding adequate help.  Depression screen [facility] 2/9 10/16/2019 03/29/2017 05/02/2015  Decreased Interest 0 0 0  Down, Depressed, Hopeless 1 0 0  PHQ - 2 Score 1 0 0    Allergies  Allergen Reactions  . Ciprofloxacin Other (See Comments)    Elevated creatinine   Social History   Social History Narrative   Married, [place].   Has two daughters, Lisette Abu and Toy Care.   Pastor. College education.   Drink caffeinated beverages. Uses herbal remedies. Takes a daily vitamin.   Wears his seatbelt, wears a bicycle helmet. Smoke alarm in the home. Firearms in the home.   Feels safe in his relationship.   Past Medical History:  Diagnosis Date  . GERD (gastroesophageal reflux disease)   . Thyroid disease    Past Surgical History:  Procedure Laterality Date  . COLONOSCOPY     no polyps  . UPPER GASTROINTESTINAL ENDOSCOPY    . VASECTOMY     Family  History  Problem Relation Age of Onset  . Breast cancer Mother   . Hypertension Mother   . Arthritis Mother   . Skin cancer Brother   . Breast cancer Maternal Aunt   . Heart disease Maternal Uncle 33       33   . Colon cancer Neg Hx   . Esophageal cancer Neg Hx   . Inflammatory bowel disease Neg Hx   . Liver disease Neg Hx   . Pancreatic cancer Neg Hx   . Rectal cancer Neg Hx   . Stomach cancer Neg Hx   . Colon polyps Neg Hx    Allergies as of 10/16/2019      Reactions   Ciprofloxacin Other (See Comments)   Elevated creatinine      Medication List       Accurate as of October 16, 2019  2:40 PM. If you have any questions, ask your nurse or doctor.        levothyroxine 175 MCG tablet Commonly known as: SYNTHROID Take 175 mcg by mouth daily before breakfast.   omeprazole 40 MG capsule Commonly known as: PRILOSEC TAKE 1 CAPSULE(40 MG) BY MOUTH DAILY   vitamin C 500 MG tablet Commonly known as: ASCORBIC ACID Take 500 mg by mouth daily.   Vitamin D3 25 MCG (1000 UT) Caps Take 1 capsule (1,000 Units total) by mouth daily.  All past medical history, surgical history, allergies, family history, immunizations andmedications were updated in the EMR today and reviewed under the history and medication portions of their EMR.     ROS: Negative, with the exception of above mentioned in HPI   Objective:  BP 126/84   Pulse 94   Temp 98 F (36.7 C) (Oral)   Ht 6\' 4"  (1.93 m)   Wt 227 lb 8 oz (103.2 kg)   SpO2 96%   BMI 27.69 kg/m  Body mass index is 27.69 kg/m. Gen: Afebrile. No acute distress. Nontoxic in appearance, well developed, well nourished.  Very pleasant Caucasian male HENT: AT. [place].  MMM, no oral lesions. Bilateral nares without erythema, swelling.  Septum midline without deviation.. Throat without erythema or exudates.  Normal posterior pharynx.  No cough.  No hoarseness. Eyes:Pupils Equal Round Reactive to light, Extraocular movements intact,   Conjunctiva without redness, discharge or icterus. Neck/lymp/endocrine: Supple, no lymphadenopathy CV: RRR  Chest: CTAB Psych: Normal affect, dress and demeanor. Normal speech. Normal thought content and judgment.  No exam data present No results found. No results found for this or any previous visit (from the past 24 hour(s)).  Assessment/Plan: Andres Brooks is a 44 y.o. male present for OV for  Witnessed apneic spells/daytime somnolence Discussed options with him today.  Exam of nasal passages and posterior pharynx are normal today.  His wife is witnessing apneic spells, therefore refer to neurology for sleep evaluation. - Ambulatory referral to Neurology  Stress: Discussed options with him today. Start Lexapro 5 mg daily> after 3 weeks if feeling he needs additional dosage he can increase to 10 mg daily on his own.  I have encouraged him to call in if he needs to increase his dose that way we can refill his medication for him at dose appropriate. Follow-up 5.5 months, sooner if needed   Reviewed expectations re: course of current medical issues.  Discussed self-management of symptoms.  Outlined signs and symptoms indicating need for more acute intervention.  Patient verbalized understanding and all questions were answered.  Patient received an After-Visit Summary.    No orders of the defined types were placed in this encounter.  No orders of the defined types were placed in this encounter.  Referral Orders  No referral(s) requested today     Note is dictated utilizing voice recognition software. Although note has been proof read prior to signing, occasional typographical errors still can be missed. If any questions arise, please do not hesitate to call for verification.   electronically signed by:  47, DO  [facility] - [place]

## 2019-10-21 DIAGNOSIS — F439 Reaction to severe stress, unspecified: Secondary | ICD-10-CM | POA: Insufficient documentation

## 2019-10-21 DIAGNOSIS — R0681 Apnea, not elsewhere classified: Secondary | ICD-10-CM | POA: Insufficient documentation

## 2019-10-21 DIAGNOSIS — R400 Somnolence: Secondary | ICD-10-CM | POA: Insufficient documentation

## 2019-11-21 ENCOUNTER — Other Ambulatory Visit: Payer: Self-pay

## 2019-11-21 ENCOUNTER — Encounter: Payer: Self-pay | Admitting: Neurology

## 2019-11-21 ENCOUNTER — Ambulatory Visit (INDEPENDENT_AMBULATORY_CARE_PROVIDER_SITE_OTHER): Payer: BLUE CROSS/BLUE SHIELD | Admitting: Neurology

## 2019-11-21 VITALS — BP 122/84 | HR 99 | Ht 76.0 in | Wt 237.0 lb

## 2019-11-21 DIAGNOSIS — G4700 Insomnia, unspecified: Secondary | ICD-10-CM | POA: Diagnosis not present

## 2019-11-21 DIAGNOSIS — R0683 Snoring: Secondary | ICD-10-CM | POA: Diagnosis not present

## 2019-11-21 DIAGNOSIS — E663 Overweight: Secondary | ICD-10-CM

## 2019-11-21 DIAGNOSIS — G4719 Other hypersomnia: Secondary | ICD-10-CM

## 2019-11-21 DIAGNOSIS — R0681 Apnea, not elsewhere classified: Secondary | ICD-10-CM

## 2019-11-21 DIAGNOSIS — Z820 Family history of epilepsy and other diseases of the nervous system: Secondary | ICD-10-CM

## 2019-11-21 NOTE — Patient Instructions (Signed)

## 2019-11-21 NOTE — Progress Notes (Signed)
Subjective:    Patient ID: Andres Brooks is a 44 y.o. male.  HPI     Huston Foley, MD, PhD [facility] [address], [address] [address] [place], [place] [address]  Dear Dr. Claiborne Billings,   I saw your patient, Andres Brooks, upon your kind request in my sleep clinic today for initial consultation of his sleep disorder, in particular, concern for underlying obstructive sleep apnea.  The patient is unaccompanied today.  As you know, Andres Brooks is a 44 year old right-handed gentleman with an underlying medical history of hypothyroidism, reflux disease, anxiety and overweight state, who reports snoring and excessive daytime somnolence as well as witnessed apneas per wife's report.  I reviewed your office note from 10/16/2019.  He was started on low-dose Lexapro at the time.  His Epworth sleepiness score is elevated at 18 out of 24 today, fatigue severity score is 23 out of 67.  He has never had a sleep study.  Snoring can be loud and disturbing to his wife and sometimes she worries about his breathing and shakes him and ask him if he is okay.  Bedtime is around 10 and rise time between 530 and 6.  He does not typically have difficulty falling asleep but has had chronic difficulty maintaining sleep and sometimes, infrequently, takes an over-the-counter sleep aid.  He has aunts on both side of the family with sleep apnea.  He has not noticed any significant improvement in his stress level or anxiety when starting Lexapro.  He does admit that he has forgotten it about 10 days total.  He is encouraged to take it consistently, and reminded that it can take several weeks for an SSRI to start making a difference. He denies any night to night nocturia or recurrent morning headaches.  He does not endorse any telltale symptoms of restless leg syndrome or twitching at night.  He does not wake up gasping or with a sense of panic.  He lives with his wife and 2 daughters, ages 77 and 79.  They have an outside  dog.  He does watch TV at night in his bedroom and has the TV on a timer.  He drinks caffeine in the form of coffee, 2 to 3 cups/day, typically no additional caffeine throughout the day.  He drinks alcohol occasionally, perhaps an average of 2 glasses of wine per week.  He is a non-smoker.  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  His Past Medical History Is Significant For: Past Medical History:  Diagnosis Date  . GERD (gastroesophageal reflux disease)   . Thyroid disease     His Past Surgical History Is Significant For: Past Surgical History:  Procedure Laterality Date  . COLONOSCOPY     no polyps  . UPPER GASTROINTESTINAL ENDOSCOPY    . VASECTOMY      His Family History Is Significant For: Family History  Problem Relation Age of Onset  . Breast cancer Mother   . Hypertension Mother   . Arthritis Mother   . High Cholesterol Mother   . Skin cancer Brother   . Breast cancer Maternal Aunt   . Heart disease Maternal Uncle 33       33    . Colon cancer Neg Hx   . Esophageal cancer Neg Hx   . Inflammatory bowel disease Neg Hx   . Liver disease Neg Hx   . Pancreatic cancer Neg Hx   . Rectal cancer Neg Hx   . Stomach  cancer Neg Hx   . Colon polyps Neg Hx     His Social History Is Significant For: Social History   Socioeconomic History  . Marital status: Married    Spouse name: Not on file  . Number of children: Not on file  . Years of education: Not on file  . Highest education level: Not on file  Occupational History  . Not on file  Tobacco Use  . Smoking status: Never Smoker  . Smokeless tobacco: Never Used  Vaping Use  . Vaping Use: Never used  Substance and Sexual Activity  . Alcohol use: Yes    Alcohol/week: 2.0 standard drinks    Types: 2 Glasses of wine per week  . Drug use: No  . Sexual activity: Yes  Other Topics Concern  . Not on file  Social History Narrative   Married, [place].   Has two daughters, Lisette Abu and Toy Care.    Pastor. College education.   Drink caffeinated beverages. Uses herbal remedies. Takes a daily vitamin.   Wears his seatbelt, wears a bicycle helmet. Smoke alarm in the home. Firearms in the home.   Feels safe in his relationship.   Social Determinants of Health   Financial Resource Strain:   . Difficulty of Paying Living Expenses: Not on file  Food Insecurity:   . Worried About Programme researcher, broadcasting/film/video in the Last Year: Not on file  . Ran Out of Food in the Last Year: Not on file  Transportation Needs:   . Lack of Transportation (Medical): Not on file  . Lack of Transportation (Non-Medical): Not on file  Physical Activity:   . Days of Exercise per Week: Not on file  . Minutes of Exercise per Session: Not on file  Stress:   . Feeling of Stress : Not on file  Social Connections:   . Frequency of Communication with Friends and Family: Not on file  . Frequency of Social Gatherings with Friends and Family: Not on file  . Attends Religious Services: Not on file  . Active Member of Clubs or Organizations: Not on file  . Attends Banker Meetings: Not on file  . Marital Status: Not on file    His Allergies Are:  Allergies  Allergen Reactions  . Ciprofloxacin Other (See Comments)    Elevated creatinine  :   His Current Medications Are:  Outpatient Encounter Medications as of 11/21/2019  Medication Sig  . Cholecalciferol (VITAMIN D3) 25 MCG (1000 UT) CAPS Take 1 capsule (1,000 Units total) by mouth daily.  Marland Kitchen escitalopram (LEXAPRO) 5 MG tablet Take 1 tablet (5 mg total) by mouth daily.  Marland Kitchen levothyroxine (SYNTHROID, LEVOTHROID) 175 MCG tablet Take 175 mcg by mouth daily before breakfast.  . omeprazole (PRILOSEC) 40 MG capsule TAKE 1 CAPSULE(40 MG) BY MOUTH DAILY  . vitamin C (ASCORBIC ACID) 500 MG tablet Take 500 mg by mouth daily.   No facility-administered encounter medications on file as of 11/21/2019.  :  Review of Systems:  Out of a complete 14 point review of systems,  all are reviewed and negative with the exception of these symptoms as listed below: Review of Systems  Neurological:       Here for sleep consult. No prior sleep study, pt reports he does snore at night.  Epworth Sleepiness Scale 0= would never doze 1= slight chance of dozing 2= moderate chance of dozing 3= high chance of dozing  Sitting and reading:3 Watching TV:3 Sitting inactive in a public place (  ex. Theater or meeting):3 As a passenger in a car for an hour without a break:2 Lying down to rest in the afternoon:3 Sitting and talking to someone:1 Sitting quietly after lunch (no alcohol):3 In a car, while stopped in traffic:0 Total:18     Objective:  Neurological Exam  Physical Exam Physical Examination:   Vitals:   11/21/19 0747  BP: 122/84  Pulse: 99  SpO2: 96%    General Examination: The patient is a very pleasant 44 y.o. male in no acute distress. He appears well-developed and well-nourished and well groomed.   HEENT: Normocephalic, atraumatic, pupils are equal, round and reactive to light, extraocular tracking is good without limitation to gaze excursion or nystagmus noted. Hearing is grossly intact. Face is symmetric with normal facial animation. Speech is clear with no dysarthria noted. There is no hypophonia. There is no lip, neck/head, jaw or voice tremor. Neck is supple with full range of passive and active motion. There are no carotid bruits on auscultation. Oropharynx exam reveals: mild mouth dryness, good dental hygiene and moderate airway crowding, due to tonsillar size of 1-2+, somewhat smaller airway entry, larger uvula noted, Mallampati class II, tongue protrudes centrally and palate elevates symmetrically.  Neck circumference of 16-1/2 inches.  He has a minimal overbite.  Nasal inspection reveals no significant mucosal bogginess, somewhat narrow nasal passages, no significant deviated septum.  Chest: Clear to auscultation without wheezing, rhonchi or crackles  noted.  Heart: S1+S2+0, regular and normal without murmurs, rubs or gallops noted.   Abdomen: Soft, non-tender and non-distended with normal bowel sounds appreciated on auscultation.  Extremities: There is no pitting edema in the distal lower extremities bilaterally.   Skin: Warm and dry without trophic changes noted.   Musculoskeletal: exam reveals no obvious joint deformities, tenderness or joint swelling or erythema.   Neurologically:  Mental status: The patient is awake, alert and oriented in all 4 spheres. His immediate and remote memory, attention, language skills and fund of knowledge are appropriate. There is no evidence of aphasia, agnosia, apraxia or anomia. Speech is clear with normal prosody and enunciation. Thought process is linear. Mood is normal and affect is normal.  Cranial nerves II - XII are as described above under HEENT exam.  Motor exam: Normal bulk, strength and tone is noted. There is no tremor, Romberg is negative. Fine motor skills and coordination: grossly intact.  Cerebellar testing: No dysmetria or intention tremor. There is no truncal or gait ataxia.  Sensory exam: intact to light touch in the upper and lower extremities.  Gait, station and balance: He stands easily. No veering to one side is noted. No leaning to one side is noted. Posture is age-appropriate and stance is narrow based. Gait shows normal stride length and normal pace. No problems turning are noted. Tandem walk is unremarkable.                Assessment and Plan:   In summary, Avel Sensorndrew Windsor is a very pleasant 44 y.o.-year old male with an underlying medical history of hypothyroidism, reflux disease, anxiety and overweight state, whose history and physical exam are concerning for obstructive sleep apnea (OSA). I had a long chat with the patient about my findings and the diagnosis of OSA, its prognosis and treatment options. We talked about medical treatments, surgical interventions and  non-pharmacological approaches. I explained in particular the risks and ramifications of untreated moderate to severe OSA, especially with respect to developing cardiovascular disease down the Road, including congestive heart failure,  difficult to treat hypertension, cardiac arrhythmias, or stroke. Even type 2 diabetes has, in part, been linked to untreated OSA. Symptoms of untreated OSA include daytime sleepiness, memory problems, mood irritability and mood disorder such as depression and anxiety, lack of energy, as well as recurrent headaches, especially morning headaches. We talked about trying to maintain a healthy lifestyle in general, as well as the importance of weight control. We also talked about the importance of good sleep hygiene. I recommended the following at this time: sleep study.  I explained the sleep test procedure to the patient and also outlined possible surgical and non-surgical treatment options of OSA, including the use of a custom-made dental device (which would require a referral to a specialist dentist or oral surgeon), upper airway surgical options, such as traditional UPPP or a novel less invasive surgical option in the form of Inspire hypoglossal nerve stimulation (which would involve a referral to an ENT surgeon). I also explained the CPAP treatment option to the patient, who indicated that he would be willing to try CPAP if the need arises. I explained the importance of being compliant with PAP treatment, not only for insurance purposes but primarily to improve His symptoms, and for the patient's long term health benefit, including to reduce His cardiovascular risks. I answered all his questions today and the patient was in agreement. I plan to see him back after the sleep study is completed and encouraged him to call with any interim questions, concerns, problems or updates.   Thank you very much for allowing me to participate in the care of this nice patient. If I can be of any  further assistance to you please do not hesitate to call me at [phone].  Sincerely,   Huston Foley, MD, PhD

## 2019-11-29 ENCOUNTER — Telehealth: Payer: Self-pay

## 2019-11-29 NOTE — Telephone Encounter (Signed)
Pt wants to schedule HST after 12/1

## 2019-12-07 ENCOUNTER — Other Ambulatory Visit: Payer: Self-pay | Admitting: Gastroenterology

## 2019-12-07 DIAGNOSIS — K219 Gastro-esophageal reflux disease without esophagitis: Secondary | ICD-10-CM

## 2020-04-14 ENCOUNTER — Ambulatory Visit (INDEPENDENT_AMBULATORY_CARE_PROVIDER_SITE_OTHER): Payer: BLUE CROSS/BLUE SHIELD | Admitting: Neurology

## 2020-04-14 DIAGNOSIS — Z820 Family history of epilepsy and other diseases of the nervous system: Secondary | ICD-10-CM

## 2020-04-14 DIAGNOSIS — G4733 Obstructive sleep apnea (adult) (pediatric): Secondary | ICD-10-CM

## 2020-04-14 DIAGNOSIS — G4700 Insomnia, unspecified: Secondary | ICD-10-CM

## 2020-04-14 DIAGNOSIS — R0683 Snoring: Secondary | ICD-10-CM

## 2020-04-14 DIAGNOSIS — E663 Overweight: Secondary | ICD-10-CM

## 2020-04-14 DIAGNOSIS — R0681 Apnea, not elsewhere classified: Secondary | ICD-10-CM

## 2020-04-14 DIAGNOSIS — G4719 Other hypersomnia: Secondary | ICD-10-CM

## 2020-04-15 NOTE — Progress Notes (Signed)
° °

## 2020-04-16 ENCOUNTER — Encounter: Payer: Self-pay | Admitting: Family Medicine

## 2020-04-16 DIAGNOSIS — G4733 Obstructive sleep apnea (adult) (pediatric): Secondary | ICD-10-CM | POA: Insufficient documentation

## 2020-04-16 NOTE — Procedures (Signed)
   [facility]  HOME SLEEP TEST (Watch PAT)  STUDY DATE: 04/14/20  DOB: 1975-03-09  MRN: [id number]  ORDERING CLINICIAN: Huston Foley, MD, PhD   REFERRING CLINICIANClaiborne Billings, Renee A, DO   CLINICAL INFORMATION/HISTORY: 45 year old man with a history of hypothyroidism, reflux disease, anxiety and overweight state, who reports snoring and excessive daytime somnolence as well as witnessed apneas per wife's report.    Epworth sleepiness score: 18/24.  BMI: 29.0 kg/m  Neck Circumference: 16.5 "  FINDINGS:   Total Record Time (hours, min): 7 H 27 min  Total Sleep Time (hours, min):  6 H 48 min   Percent REM (%):    18.98 %   Calculated pAHI (per hour): 14.8       REM pAHI: 21.3   NREM pAHI: 13.3 Supine AHI: 12.6   Oxygen Saturation (%) Mean: 94  Minimum oxygen saturation (%):        98   O2 Saturation Range (%): 87-98  O2Saturation (minutes) <=88%: 0.5 min   Pulse Mean (bpm):    64  Pulse Range (44-100)   IMPRESSION: OSA (obstructive sleep apnea)   RECOMMENDATION:  This home sleep test demonstrates overall mild obstructive sleep apnea (near-moderate by AHI criteria) with a total AHI of 14.8/hour and O2 nadir of 87%.  Snoring was noted and appeared to be in the mild to moderate range.  Given the patient's medical history and sleep related complaints, treatment with positive airway pressure is recommended. This can be achieved in the form of autoPAP trial/titration at home. A  full night CPAP titration study will help with proper treatment settings and mask fitting if needed. Alternative treatments may include weight loss along with avoidance of the supine sleep position, or an oral appliance in appropriate candidates.  Consultation with ENT for surgical options can be considered. Please note that untreated obstructive sleep apnea may carry additional perioperative morbidity. Patients with significant obstructive sleep apnea should receive perioperative PAP therapy and the  surgeons and particularly the anesthesiologist should be informed of the diagnosis and the severity of the sleep disordered breathing. The patient should be cautioned not to drive, work at heights, or operate dangerous or heavy equipment when tired or sleepy. Review and reiteration of good sleep hygiene measures should be pursued with any patient. Other causes of the patient's symptoms, including circadian rhythm disturbances, an underlying mood disorder, medication effect and/or an underlying medical problem cannot be ruled out based on this test. Clinical correlation is recommended.   The patient and his referring provider will be notified of the test results. The patient will be seen in follow up in sleep clinic at [facility].  I certify that I have reviewed the raw data recording prior to the issuance of this report in accordance with the standards of the American Academy of Sleep Medicine (AASM).  INTERPRETING PHYSICIAN:  Huston Foley, MD, PhD  Board Certified in Neurology and Sleep Medicine [facility] [address] [place], [place] [address] [phone]

## 2020-04-16 NOTE — Progress Notes (Signed)
Patient referred by Dr. Claiborne Billings, seen by me on 11/21/19, HST on 04/14/20.    Please call and notify the patient that the recent home sleep test showed obstructive sleep apnea. OSA is in the mild (near-moderate range by AHI), and worth treating to see if he feels better after treatment. To that end I recommend treatment for this in the form of autoPAP, which means, that we don't have to bring him in for a sleep study with CPAP, but will let him try an autoPAP machine at home, through a DME company (of his choice, or as per insurance requirement). The DME representative will educate him on how to use the machine, how to put the mask on, etc. I have placed an order in the chart. Please send referral, talk to patient, send report to referring MD. We will need a FU in sleep clinic for 10 weeks post-PAP set up, please arrange that with me or one of our NPs. Thanks,   Huston Foley, MD, PhD [facility])

## 2020-04-16 NOTE — Addendum Note (Signed)
Addended by: Huston Foley on: 04/16/2020 08:00 AM   Modules accepted: Orders

## 2020-04-17 ENCOUNTER — Telehealth: Payer: Self-pay

## 2020-04-17 NOTE — Telephone Encounter (Signed)
-----   Message from Huston Foley, MD sent at 04/16/2020  8:00 AM EST ----- Patient referred by Dr. Claiborne Billings, seen by me on 11/21/19, HST on 04/14/20.    Please call and notify the patient that the recent home sleep test showed obstructive sleep apnea. OSA is in the mild (near-moderate range by AHI), and worth treating to see if he feels better after treatment. To that end I recommend treatment for this in the form of autoPAP, which means, that we don't have to bring him in for a sleep study with CPAP, but will let him try an autoPAP machine at home, through a DME company (of his choice, or as per insurance requirement). The DME representative will educate him on how to use the machine, how to put the mask on, etc. I have placed an order in the chart. Please send referral, talk to patient, send report to referring MD. We will need a FU in sleep clinic for 10 weeks post-PAP set up, please arrange that with me or one of our NPs. Thanks,   Huston Foley, MD, PhD [facility])

## 2020-04-17 NOTE — Telephone Encounter (Signed)
I called the patient and results of sleep study.  We discussed at length the option of auto Pap along with oral appliance.  Patient at this point is on decisive on which when to pursue.  He would like to take a few weeks to think about it and then give me a call on which he would like to pursue.  Patient was advised I would like to hear from him and we could proceed from there.  Patient states he will call back within 1 to 2 weeks and let me know his decision.

## 2020-08-20 DIAGNOSIS — E039 Hypothyroidism, unspecified: Secondary | ICD-10-CM | POA: Diagnosis not present

## 2021-04-03 ENCOUNTER — Encounter: Payer: BLUE CROSS/BLUE SHIELD | Admitting: Family Medicine

## 2021-04-08 ENCOUNTER — Ambulatory Visit (INDEPENDENT_AMBULATORY_CARE_PROVIDER_SITE_OTHER): Payer: BC Managed Care – PPO | Admitting: Family Medicine

## 2021-04-08 ENCOUNTER — Other Ambulatory Visit: Payer: Self-pay

## 2021-04-08 ENCOUNTER — Encounter: Payer: Self-pay | Admitting: Family Medicine

## 2021-04-08 VITALS — BP 114/78 | HR 79 | Temp 98.3°F | Ht 76.0 in | Wt 244.0 lb

## 2021-04-08 DIAGNOSIS — E781 Pure hyperglyceridemia: Secondary | ICD-10-CM

## 2021-04-08 DIAGNOSIS — G4733 Obstructive sleep apnea (adult) (pediatric): Secondary | ICD-10-CM | POA: Diagnosis not present

## 2021-04-08 DIAGNOSIS — E538 Deficiency of other specified B group vitamins: Secondary | ICD-10-CM

## 2021-04-08 DIAGNOSIS — Z0000 Encounter for general adult medical examination without abnormal findings: Secondary | ICD-10-CM

## 2021-04-08 DIAGNOSIS — K219 Gastro-esophageal reflux disease without esophagitis: Secondary | ICD-10-CM

## 2021-04-08 DIAGNOSIS — Z79899 Other long term (current) drug therapy: Secondary | ICD-10-CM | POA: Diagnosis not present

## 2021-04-08 DIAGNOSIS — E559 Vitamin D deficiency, unspecified: Secondary | ICD-10-CM | POA: Diagnosis not present

## 2021-04-08 DIAGNOSIS — E663 Overweight: Secondary | ICD-10-CM

## 2021-04-08 DIAGNOSIS — Z1159 Encounter for screening for other viral diseases: Secondary | ICD-10-CM | POA: Diagnosis not present

## 2021-04-08 DIAGNOSIS — Z9989 Dependence on other enabling machines and devices: Secondary | ICD-10-CM | POA: Diagnosis not present

## 2021-04-08 DIAGNOSIS — J020 Streptococcal pharyngitis: Secondary | ICD-10-CM

## 2021-04-08 DIAGNOSIS — J029 Acute pharyngitis, unspecified: Secondary | ICD-10-CM | POA: Diagnosis not present

## 2021-04-08 LAB — COMPREHENSIVE METABOLIC PANEL
ALT: 38 U/L (ref 0–53)
AST: 24 U/L (ref 0–37)
Albumin: 4.9 g/dL (ref 3.5–5.2)
Alkaline Phosphatase: 80 U/L (ref 39–117)
BUN: 17 mg/dL (ref 6–23)
CO2: 26 mEq/L (ref 19–32)
Calcium: 9.8 mg/dL (ref 8.4–10.5)
Chloride: 106 mEq/L (ref 96–112)
Creatinine, Ser: 1.01 mg/dL (ref 0.40–1.50)
GFR: 91.02 mL/min (ref >60.00)
Glucose, Bld: 89 mg/dL (ref 70–99)
Potassium: 4.7 mEq/L (ref 3.5–5.1)
Sodium: 142 mEq/L (ref 135–145)
Total Bilirubin: 0.6 mg/dL (ref 0.2–1.2)
Total Protein: 7.9 g/dL (ref 6.0–8.3)

## 2021-04-08 LAB — POC COVID19 BINAXNOW: SARS Coronavirus 2 Ag: NEGATIVE

## 2021-04-08 LAB — HEMOGLOBIN A1C: Hgb A1c MFr Bld: 5.5 % (ref 4.6–6.5)

## 2021-04-08 LAB — LDL CHOLESTEROL, DIRECT: Direct LDL: 130 mg/dL

## 2021-04-08 LAB — CBC
HCT: 43.9 % (ref 39.0–52.0)
Hemoglobin: 14.4 g/dL (ref 13.0–17.0)
MCHC: 32.9 g/dL (ref 30.0–36.0)
MCV: 82.3 fl (ref 78.0–100.0)
Platelets: 287 10*3/uL (ref 150.0–400.0)
RBC: 5.33 Mil/uL (ref 4.22–5.81)
RDW: 14.2 % (ref 11.5–15.5)
WBC: 11.7 10*3/uL — ABNORMAL HIGH (ref 4.0–10.5)

## 2021-04-08 LAB — POCT INFLUENZA A/B
Influenza A, POC: NEGATIVE
Influenza B, POC: NEGATIVE

## 2021-04-08 LAB — LIPID PANEL
Cholesterol: 206 mg/dL — ABNORMAL HIGH (ref 0–200)
HDL: 39.7 mg/dL (ref >39.00)
NonHDL: 166.33
Total CHOL/HDL Ratio: 5
Triglycerides: 214 mg/dL — ABNORMAL HIGH (ref 0.0–149.0)
VLDL: 42.8 mg/dL — ABNORMAL HIGH (ref 0.0–40.0)

## 2021-04-08 LAB — VITAMIN B12: Vitamin B-12: 214 pg/mL (ref 211–911)

## 2021-04-08 LAB — POCT RAPID STREP A (OFFICE): Rapid Strep A Screen: NEGATIVE

## 2021-04-08 LAB — VITAMIN D 25 HYDROXY (VIT D DEFICIENCY, FRACTURES): VITD: 26.05 ng/mL — ABNORMAL LOW (ref 30.00–100.00)

## 2021-04-08 LAB — TSH: TSH: 1.76 u[IU]/mL (ref 0.35–5.50)

## 2021-04-08 MED ORDER — OMEPRAZOLE 40 MG PO CPDR: DELAYED_RELEASE_CAPSULE | ORAL | 3 refills | Status: DC

## 2021-04-08 MED ORDER — AMOXICILLIN-POT CLAVULANATE 875-125 MG PO TABS: 1.0000 | ORAL_TABLET | Freq: Two times a day (BID) | ORAL | 0 refills | Status: DC

## 2021-04-08 MED ORDER — METHYLPREDNISOLONE ACETATE 80 MG/ML IJ SUSP
80.0000 mg | Freq: Once | INTRAMUSCULAR | Status: AC
  Administered 2021-04-08: 80 mg via INTRAMUSCULAR

## 2021-04-08 NOTE — Patient Instructions (Signed)

## 2021-04-08 NOTE — Progress Notes (Signed)
This visit occurred during the SARS-CoV-2 public health emergency.  Safety protocols were in place, including screening questions prior to the visit, additional usage of staff PPE, and extensive cleaning of exam room while observing appropriate contact time as indicated for disinfecting solutions.    Patient ID: Andres Brooks, male  DOB: Sep 28, 1975, 46 y.o.   MRN: [id number] Patient [facility] Team    Relationship Specialty Notifications Start End  Ma Hillock, DO PCP - General Family Medicine  03/03/15   Mansouraty, Telford Nab., MD Consulting Physician Gastroenterology  04/08/21   Star Age, MD Attending Physician Neurology  04/08/21    Comment: OSA  [name], MD  Endocrinology  04/08/21    Comment: thyroid    Chief Complaint  Patient presents with   Nasal Congestion    Pt c/o nasal congestion and sore throat x 2 days;    Annual Exam    Pt is fasting    Subjective: Andres Brooks is a 46 y.o. male present for CPE/acute visit combo. All past medical history, surgical history, allergies, family history, immunizations, medications and social history were updated in the electronic medical record today. All recent labs, ED visits and hospitalizations within the last year were reviewed.  Health maintenance:  Colonoscopy: no FHx, screen 47 Immunizations: Due- tdap, flu declined, covid completed Infectious disease screen:HIV completed. Hep c collected today PSA: screen at 40, no FHX No results found for: PSA, pt was counseled on prostate cancer screenings.  Assistive device: none Oxygen [id number] Patient has a Dental home. Hospitalizations/ED visits: reviewed  Sore throat:  Pt complains of Sore throat and congested of 3 days duration. He reports a mouth ulcer formed just prior to onset of current symptoms.. Nasal drainage, clearing cough-dry, sinus pressure.  He has tried Ziacam, energy C, Mucinex DM. He has had 2 Pfizer COVID vaccines, no booster He has not had his flu vaccine  this year. 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  Depression screen [facility] 2/9 04/08/2021 10/16/2019 03/29/2017 05/02/2015  Decreased Interest 0 0 0 0  Down, Depressed, Hopeless 0 1 0 0  PHQ - 2 Score 0 1 0 0   GAD 7 : Generalized Anxiety Score 10/16/2019  Nervous, Anxious, on Edge 2  Control/stop worrying 2  Worry too much - different things 3  Trouble relaxing 1  Restless 0  Easily annoyed or irritable 0  Afraid - awful might happen 0  Total GAD 7 Score 8  Anxiety Difficulty Somewhat difficult       Fall Risk  10/16/2019 05/02/2015  Falls in the past year? 0 No  Number falls in past yr: 0 -  Injury with Fall? 0 -  Follow up Falls evaluation completed -   Immunization History  Administered Date(s) Administered   PFIZER(Purple Top)SARS-COV-2 Vaccination 04/26/2019, 05/17/2019   Tdap 08/14/2008, 02/23/2010   Past Medical History:  Diagnosis Date   Chronic throat clearing 03/13/2018   GERD (gastroesophageal reflux disease)    Kidney stones 03/10/2011   Thyroid disease    Allergies  Allergen Reactions   Ciprofloxacin Other (See Comments)    Elevated creatinine   Past Surgical History:  Procedure Laterality Date   COLONOSCOPY     no polyps   UPPER GASTROINTESTINAL ENDOSCOPY     VASECTOMY     Family History  Problem Relation Age of Onset   Breast cancer Mother    Hypertension Mother    Arthritis Mother  High Cholesterol Mother    Skin cancer Brother    Breast cancer Maternal Aunt    Heart disease Maternal Uncle 62       33    Colon cancer Neg Hx    Esophageal cancer Neg Hx    Inflammatory bowel disease Neg Hx    Liver disease Neg Hx    Pancreatic cancer Neg Hx    Rectal cancer Neg Hx    Stomach cancer Neg Hx    Colon polyps Neg Hx    Social History   Social History Narrative   Married, Andres Brooks.   Has two daughters, Andres Brooks and Andres Brooks.   Past[employer]. College education.   Drink caffeinated beverages. Uses herbal remedies. Takes a  daily vitamin.   Wears his seatbelt, wears a bicycle helmet. Smoke alarm in the home. Firearms in the home.   Feels safe in his relationship.    Allergies as of 04/08/2021       Reactions   Ciprofloxacin Other (See Comments)   Elevated creatinine        Medication List        Accurate as of April 08, 2021 12:28 PM. If you have any questions, ask your nurse or doctor.          STOP taking these medications    escitalopram 5 MG tablet Commonly known as: Lexapro Stopped by: Felix Pacini, DO       TAKE these medications    amoxicillin-clavulanate 875-125 MG tablet Commonly known as: AUGMENTIN Take 1 tablet by mouth 2 (two) times daily. Started by: Felix Pacini, DO   levothyroxine 175 MCG tablet Commonly known as: SYNTHROID Take 175 mcg by mouth daily before breakfast.   omeprazole 40 MG capsule Commonly known as: PRILOSEC TAKE 1 CAPSULE(40 MG) BY MOUTH DAILY   vitamin C 500 MG tablet Commonly known as: ASCORBIC ACID Take 500 mg by mouth daily.   Vitamin D3 25 MCG (1000 UT) Caps Take 1 capsule (1,000 Units total) by mouth daily.       All past medical history, surgical history, allergies, family history, immunizations andmedications were updated in the EMR today and reviewed under the history and medication portions of their EMR.     Recent Results (from the past 2160 hour(s))  POC COVID-19 BinaxNow     Status: None   Collection Time: 04/08/21  8:53 AM  Result Value Ref Range   SARS Coronavirus 2 Ag Negative Negative  POCT rapid strep A     Status: None   Collection Time: 04/08/21  8:53 AM  Result Value Ref Range   Rapid Strep A Screen Negative Negative  POCT Influenza A/B     Status: None   Collection Time: 04/08/21  8:53 AM  Result Value Ref Range   Influenza A, POC Negative Negative   Influenza B, POC Negative Negative    ROS 14 pt review of systems performed and negative (unless mentioned in an HPI)  Objective:  BP 114/78    Pulse 79     Temp 98.3 F (36.8 C) (Oral)    Ht 6\' 4"  (1.93 m)    Wt 244 lb (110.7 kg)    SpO2 99%    BMI 29.70 kg/m  Physical Exam Constitutional:      General: He is not in acute distress.    Appearance: Normal appearance. He is not ill-appearing, toxic-appearing or diaphoretic.  HENT:     Head: Normocephalic and atraumatic.     Right Ear: Tympanic  membrane, ear canal and external ear normal. There is no impacted cerumen.     Left Ear: Tympanic membrane, ear canal and external ear normal. There is no impacted cerumen.     Nose: Nose normal. No congestion or rhinorrhea.     Mouth/Throat:     Mouth: Mucous membranes are moist.     Pharynx: Oropharyngeal exudate and posterior oropharyngeal erythema present.     Comments: Small ulceration posterior lower gumline.  No bleeding or drainage. Eyes:     General: No scleral icterus.       Right eye: No discharge.        Left eye: No discharge.     Extraocular Movements: Extraocular movements intact.     Pupils: Pupils are equal, round, and reactive to light.  Cardiovascular:     Rate and Rhythm: Normal rate and regular rhythm.     Pulses: Normal pulses.     Heart sounds: Normal heart sounds. No murmur heard.   No friction rub. No gallop.  Pulmonary:     Effort: Pulmonary effort is normal. No respiratory distress.     Breath sounds: Normal breath sounds. No stridor. No wheezing, rhonchi or rales.  Chest:     Chest wall: No tenderness.  Abdominal:     General: Abdomen is flat. Bowel sounds are normal. There is no distension.     Palpations: Abdomen is soft. There is no mass.     Tenderness: There is no abdominal tenderness. There is no right CVA tenderness, left CVA tenderness, guarding or rebound.     Hernia: No hernia is present.  Musculoskeletal:        General: No swelling or tenderness. Normal range of motion.     Cervical back: Normal range of motion and neck supple. No tenderness.     Right lower leg: No edema.     Left lower leg: No  edema.  Lymphadenopathy:     Cervical: No cervical adenopathy.  Skin:    General: Skin is warm and dry.     Coloration: Skin is not jaundiced.     Findings: No bruising, lesion or rash.  Neurological:     General: No focal deficit present.     Mental Status: He is alert and oriented to person, place, and time. Mental status is at baseline.     Cranial Nerves: No cranial nerve deficit.     Sensory: No sensory deficit.     Motor: No weakness.     Coordination: Coordination normal.     Gait: Gait normal.     Deep Tendon Reflexes: Reflexes normal.  Psychiatric:        Mood and Affect: Mood normal.        Behavior: Behavior normal.        Thought Content: Thought content normal.        Judgment: Judgment normal.    No results found.  Assessment/plan: Josen Amenta is a 46 y.o. male present for CPE/Acute/chronic Hypertriglyceridemia/Overweight (BMI 25.0-29.9) - CBC - Comprehensive metabolic panel - Hemoglobin A1c - Lipid panel - TSH  Current use of proton pump inhibitor/B12 deficiency/Vitamin D deficiency - Vitamin B12 - VITAMIN D 25 Hydroxy (Vit-D Deficiency, Fractures) PPI and GERD management by GI, patient has asked for prescription to be refilled by this provider today, currently taking over-the-counter omeprazole 40 mg daily. Continue omeprazole 40 mg daily  Sore throat/pharyngitis New problem- started 2 days ago Suspect strep pharyngitis.  Point-of-Brooks testing is negative throat culture was  sent.  Given symptoms and significant swelling/erythema elected to go ahead and start treatment. Rest, hydrate.  Augmentin twice daily prescribed IM Depo-Medrol 80 provided today F/U 2 weeks if not improved.  - POC COVID-19 BinaxNow> negative - POCT rapid strep A> negative - POCT Influenza A/B> negative - Upper Respiratory Culture> pending  OSA on CPAP Managed by [facility] Encounter for hepatitis C screening test for low risk patient Hep c screen ordered  Routine general medical  examination at a health [facility] facility Colonoscopy: no FHx, screen 47 Immunizations: Due- tdap-nurse visit to have completed after acute illness is resolved, flu declined, covid completed Infectious disease screen:HIV completed. Hep c collected today PSA: screen at 40, no FHX No results found for: PSA, pt was counseled on prostate cancer screenings.  Patient was encouraged to exercise greater than 150 minutes a week. Patient was encouraged to choose a diet filled with fresh fruits and vegetables, and lean meats. AVS provided to patient today for education/recommendation on gender specific health and safety maintenance.  Return in about 1 year (around 04/09/2022) for CPE (30 min).  Orders Placed This Encounter  Procedures   Upper Respiratory Culture   CBC   Comprehensive metabolic panel   Hemoglobin A1c   Lipid panel   TSH   VITAMIN D 25 Hydroxy (Vit-D Deficiency, Fractures)   Vitamin B12   Hepatitis C Antibody   POC COVID-19 BinaxNow   POCT rapid strep A   POCT Influenza A/B    Meds ordered this encounter  Medications   amoxicillin-clavulanate (AUGMENTIN) 875-125 MG tablet    Sig: Take 1 tablet by mouth 2 (two) times daily.    Dispense:  20 tablet    Refill:  0   omeprazole (PRILOSEC) 40 MG capsule    Sig: TAKE 1 CAPSULE(40 MG) BY MOUTH DAILY    Dispense:  90 capsule    Refill:  3   methylPREDNISolone acetate (DEPO-MEDROL) injection 80 mg   Referral Orders  No referral(s) requested today     Note is dictated utilizing voice recognition software. Although note has been proof read prior to signing, occasional typographical errors still can be missed. If any questions arise, please do not hesitate to call for verification.  Electronically signed by: Howard Pouch, DO [place]

## 2021-04-09 ENCOUNTER — Other Ambulatory Visit: Payer: Self-pay | Admitting: Family Medicine

## 2021-04-09 LAB — HEPATITIS C ANTIBODY
Hepatitis C Ab: NONREACTIVE
SIGNAL TO CUT-OFF: 0.03 (ref <1.00)

## 2021-04-09 MED ORDER — VITAMIN D3 25 MCG (1000 UT) PO CAPS: 1000.0000 [IU] | ORAL_CAPSULE | Freq: Every day | ORAL | 3 refills | Status: AC

## 2021-04-09 MED ORDER — B-12 1000 MCG SL SUBL: SUBLINGUAL_TABLET | SUBLINGUAL | 3 refills | Status: AC

## 2021-04-11 LAB — CULTURE, UPPER RESPIRATORY
MICRO NUMBER:: 13016366
SPECIMEN QUALITY:: ADEQUATE

## 2021-04-13 ENCOUNTER — Telehealth: Payer: Self-pay

## 2021-04-13 NOTE — Telephone Encounter (Signed)
Patient calling back about test results.  Please call himat [phone].

## 2021-04-14 ENCOUNTER — Encounter: Payer: Self-pay | Admitting: Family Medicine

## 2021-04-14 ENCOUNTER — Ambulatory Visit (INDEPENDENT_AMBULATORY_CARE_PROVIDER_SITE_OTHER): Payer: BC Managed Care – PPO | Admitting: Family Medicine

## 2021-04-14 ENCOUNTER — Telehealth: Payer: Self-pay

## 2021-04-14 ENCOUNTER — Other Ambulatory Visit: Payer: Self-pay

## 2021-04-14 VITALS — BP 125/76 | HR 73 | Temp 97.5°F | Ht 76.0 in | Wt 242.2 lb

## 2021-04-14 DIAGNOSIS — K121 Other forms of stomatitis: Secondary | ICD-10-CM | POA: Diagnosis not present

## 2021-04-14 DIAGNOSIS — K219 Gastro-esophageal reflux disease without esophagitis: Secondary | ICD-10-CM | POA: Diagnosis not present

## 2021-04-14 DIAGNOSIS — Z23 Encounter for immunization: Secondary | ICD-10-CM | POA: Diagnosis not present

## 2021-04-14 MED ORDER — NYSTATIN 100000 UNIT/ML MT SUSP: 5.0000 mL | Freq: Four times a day (QID) | OROMUCOSAL | 0 refills | Status: DC

## 2021-04-14 MED ORDER — OMEPRAZOLE 40 MG PO CPDR: DELAYED_RELEASE_CAPSULE | ORAL | 4 refills | Status: DC

## 2021-04-14 MED ORDER — CHLORHEXIDINE GLUCONATE 0.12 % MT SOLN: 15.0000 mL | Freq: Two times a day (BID) | OROMUCOSAL | 0 refills | Status: DC

## 2021-04-14 MED ORDER — LEVOTHYROXINE SODIUM 175 MCG PO TABS: 175.0000 ug | ORAL_TABLET | Freq: Every day | ORAL | 4 refills | Status: DC

## 2021-04-14 NOTE — Telephone Encounter (Signed)
Fax received from [facility] to have the medication omeprazole sent to their mail pharmacy. Faxed placed on PCP desk.

## 2021-04-14 NOTE — Telephone Encounter (Signed)
Completed.

## 2021-04-14 NOTE — Progress Notes (Signed)
This visit occurred during the SARS-CoV-2 public health emergency.  Safety protocols were in place, including screening questions prior to the visit, additional usage of staff PPE, and extensive cleaning of exam room while observing appropriate contact time as indicated for disinfecting solutions.    Patient ID: Andres Brooks, male  DOB: 02-28-1975, 46 y.o.   MRN: [id number] Patient Care Team    Relationship Specialty Notifications Start End  [name], DO PCP - General Family Medicine  03/03/15   Mansouraty, Netty Starring., MD Consulting Physician Gastroenterology  04/08/21   Huston Foley, MD Attending Physician Neurology  04/08/21    Comment: OSA  Simon Rhein, MD  Endocrinology  04/08/21    Comment: thyroid    Chief Complaint  Patient presents with   Mouth Lesions    Pt c/o mouth lesion x 2 wks; no change     Subjective: Andres Brooks is a 46 y.o. male present for mouth ulcer and tdap All past medical history, surgical history, allergies, family history, immunizations, medications and social history were updated in the electronic medical record today. All recent labs, ED visits and hospitalizations within the last year were reviewed.  Mouth ulcer: Presents for evaluation of mouth sores.  Also presented just prior to upper respiratory infection.  Upper respiratory infection was treated with Augmentin and Depo-Medrol injection.  Patient states he felt the ulceration was a little less tender for a few days, and then has returned back to being tender.  Denies fever, bleeding or drainage from the ulceration.  He has never had an ulceration like this in the past.  He has never smoked.  He reports he had wisdom teeth but he is uncertain if he had them all extracted.  He has not seen his dentist as of yet.  Depression screen [facility] 2/9 04/08/2021 10/16/2019 03/29/2017 05/02/2015  Decreased Interest 0 0 0 0  Down, Depressed, Hopeless 0 1 0 0  PHQ - 2 Score 0 1 0 0   GAD 7 : Generalized Anxiety  Score 10/16/2019  Nervous, Anxious, on Edge 2  Control/stop worrying 2  Worry too much - different things 3  Trouble relaxing 1  Restless 0  Easily annoyed or irritable 0  Afraid - awful might happen 0  Total GAD 7 Score 8  Anxiety Difficulty Somewhat difficult       Fall Risk  10/16/2019 05/02/2015  Falls in the past year? 0 No  Number falls in past yr: 0 -  Injury with Fall? 0 -  Follow up Falls evaluation completed -   Immunization History  Administered Date(s) Administered   PFIZER(Purple Top)SARS-COV-2 Vaccination 04/26/2019, 05/17/2019   Tdap 08/14/2008, 02/23/2010, 04/14/2021   Past Medical History:  Diagnosis Date   Chronic throat clearing 03/13/2018   GERD (gastroesophageal reflux disease)    Kidney stones 03/10/2011   Thyroid disease    Allergies  Allergen Reactions   Ciprofloxacin Other (See Comments)    Elevated creatinine   Past Surgical History:  Procedure Laterality Date   COLONOSCOPY     no polyps   UPPER GASTROINTESTINAL ENDOSCOPY     VASECTOMY     Family History  Problem Relation Age of Onset   Breast cancer Mother    Hypertension Mother    Arthritis Mother    High Cholesterol Mother    Skin cancer Brother    Breast cancer Maternal Aunt    Heart disease Maternal Uncle 16       33  Colon cancer Neg Hx    Esophageal cancer Neg Hx    Inflammatory bowel disease Neg Hx    Liver disease Neg Hx    Pancreatic cancer Neg Hx    Rectal cancer Neg Hx    Stomach cancer Neg Hx    Colon polyps Neg Hx    Social History   Social History Narrative   Married, [place].   Has two daughters, Lisette Abu and Toy Care.   Pastor. College education.   Drink caffeinated beverages. Uses herbal remedies. Takes a daily vitamin.   Wears his seatbelt, wears a bicycle helmet. Smoke alarm in the home. Firearms in the home.   Feels safe in his relationship.    Allergies as of 04/14/2021       Reactions   Ciprofloxacin Other (See Comments)   Elevated creatinine         Medication List        Accurate as of April 14, 2021  4:45 PM. If you have any questions, ask your nurse or doctor.          STOP taking these medications    amoxicillin-clavulanate 875-125 MG tablet Commonly known as: AUGMENTIN Stopped by: Felix Pacini, DO       TAKE these medications    B-12 1000 MCG Subl 1000 mcg sublingual daily   chlorhexidine 0.12 % solution Commonly known as: PERIDEX Use as directed 15 mLs in the mouth or throat 2 (two) times daily. Started by: Felix Pacini, DO   levothyroxine 175 MCG tablet Commonly known as: SYNTHROID Take 1 tablet (175 mcg total) by mouth daily before breakfast.   nystatin 100000 UNIT/ML suspension Commonly known as: MYCOSTATIN Take 5 mLs (500,000 Units total) by mouth 4 (four) times daily. 5 ml swish for 30 seconds and then spit out, QID, do not swallow. Started by: Felix Pacini, DO   omeprazole 40 MG capsule Commonly known as: PRILOSEC TAKE 1 CAPSULE(40 MG) BY MOUTH DAILY   vitamin C 500 MG tablet Commonly known as: ASCORBIC ACID Take 500 mg by mouth daily.   Vitamin D3 25 MCG (1000 UT) Caps Take 1 capsule (1,000 Units total) by mouth daily.       All past medical history, surgical history, allergies, family history, immunizations andmedications were updated in the EMR today and reviewed under the history and medication portions of their EMR.       ROS 14 pt review of systems performed and negative (unless mentioned in an HPI)  Objective:  BP 125/76    Pulse 73    Temp (!) 97.5 F (36.4 C) (Oral)    Ht 6\' 4"  (1.93 m)    Wt 242 lb 3.2 oz (109.9 kg)    SpO2 96%    BMI 29.48 kg/m  Physical Exam Constitutional:      General: He is not in acute distress.    Appearance: Normal appearance. He is not ill-appearing, toxic-appearing or diaphoretic.  HENT:     Head: Normocephalic and atraumatic.     Mouth/Throat:     Mouth: Mucous membranes are moist.     Pharynx: No oropharyngeal exudate or  posterior oropharyngeal erythema.     Comments: Small ulceration x2 posterior inside lower gumline, posterior to last molar.  No bleeding or drainage. Eyes:     General: No scleral icterus.       Right eye: No discharge.        Left eye: No discharge.     Extraocular Movements: Extraocular movements intact.  Pupils: Pupils are equal, round, and reactive to light.  Musculoskeletal:     Cervical back: No tenderness.  Lymphadenopathy:     Cervical: No cervical adenopathy.  Neurological:     Mental Status: He is alert.    No results found.  Assessment/plan: Andres Brooks is a 46 y.o. male present for  Mouth ulcer Mouth ulcer appears mildly improved today.  Patient states that it had improved as far as pain for a few days, but since has returned back to prior pain level. One area appears more classic for mouth ulcer, but the other small area appears more smooth ?  Tooth eruption. We discussed differential diagnosis of ulceration, tooth eruption, including lesion that may need biopsy such as SCC Nystatin swish Peridex He is to follow-up with his dental team for further evaluation and treatment  Need for Tdap vaccination Tdap updated today for him  Gastroesophageal reflux disease without esophagitis Sent his omeprazole to his [employer].  Also sent levothyroxine. - omeprazole (PRILOSEC) 40 MG capsule; TAKE 1 CAPSULE(40 MG) BY MOUTH DAILY  Dispense: 90 capsule; Refill: 4   No follow-ups on file.  Orders Placed This Encounter  Procedures   Tdap vaccine greater than or equal to 7yo IM    Meds ordered this encounter  Medications   nystatin (MYCOSTATIN) 100000 UNIT/ML suspension    Sig: Take 5 mLs (500,000 Units total) by mouth 4 (four) times daily. 5 ml swish for 30 seconds and then spit out, QID, do not swallow.    Dispense:  100 mL    Refill:  0   chlorhexidine (PERIDEX) 0.12 % solution    Sig: Use as directed 15 mLs in the mouth or throat 2 (two) times daily.     Dispense:  120 mL    Refill:  0   levothyroxine (SYNTHROID) 175 MCG tablet    Sig: Take 1 tablet (175 mcg total) by mouth daily before breakfast.    Dispense:  90 tablet    Refill:  4   omeprazole (PRILOSEC) 40 MG capsule    Sig: TAKE 1 CAPSULE(40 MG) BY MOUTH DAILY    Dispense:  90 capsule    Refill:  4   Referral Orders  No referral(s) requested today     Note is dictated utilizing voice recognition software. Although note has been proof read prior to signing, occasional typographical errors still can be missed. If any questions arise, please do not hesitate to call for verification.  Electronically signed by: Felix Pacini, DO [facility] [place]

## 2021-04-14 NOTE — Telephone Encounter (Signed)
Pt spoke with Alveta Heimlich

## 2021-06-08 ENCOUNTER — Telehealth: Payer: Self-pay

## 2021-06-08 NOTE — Telephone Encounter (Addendum)
No record found of Hep B vaccine in chart or [facility]. LM for pt to return call. Will need to schedule OV ?

## 2021-06-08 NOTE — Telephone Encounter (Signed)
Patient going out of country - [place] ?He was checking into Hep Booster. ? ?Please call patient  [phone] ?

## 2021-06-09 NOTE — Telephone Encounter (Signed)
Called pt to sched appt with PCP, pt stated he rather go to [facility] dept since it's cheaper.  ?

## 2021-12-14 DIAGNOSIS — M546 Pain in thoracic spine: Secondary | ICD-10-CM | POA: Diagnosis not present

## 2021-12-17 DIAGNOSIS — M546 Pain in thoracic spine: Secondary | ICD-10-CM | POA: Diagnosis not present

## 2022-01-11 ENCOUNTER — Ambulatory Visit (INDEPENDENT_AMBULATORY_CARE_PROVIDER_SITE_OTHER): Payer: BC Managed Care – PPO | Admitting: Family Medicine

## 2022-01-11 VITALS — BP 129/80 | HR 71 | Temp 97.6°F | Wt 226.6 lb

## 2022-01-11 DIAGNOSIS — Z87442 Personal history of urinary calculi: Secondary | ICD-10-CM

## 2022-01-11 DIAGNOSIS — R109 Unspecified abdominal pain: Secondary | ICD-10-CM

## 2022-01-11 LAB — POCT URINALYSIS DIPSTICK
Bilirubin, UA: NEGATIVE
Blood, UA: NEGATIVE
Glucose, UA: NEGATIVE
Ketones, UA: NEGATIVE
Leukocytes, UA: NEGATIVE
Nitrite, UA: NEGATIVE
Protein, UA: NEGATIVE
Spec Grav, UA: 1.015 (ref 1.010–1.025)
Urobilinogen, UA: 0.2 E.U./dL
pH, UA: 7 (ref 5.0–8.0)

## 2022-01-11 MED ORDER — CYCLOBENZAPRINE HCL 10 MG PO TABS: 10.0000 mg | ORAL_TABLET | Freq: Three times a day (TID) | ORAL | 0 refills | Status: DC | PRN

## 2022-01-11 NOTE — Patient Instructions (Addendum)
Return in about 2 weeks (around 01/25/2022), or if symptoms worsen or fail to improve.        Great to see you today.  I have refilled the medication(s) we provide.   If labs were collected, we will inform you of lab results once received either by echart message or telephone call.   - echart message- for normal results that have been seen by the patient already.   - telephone call: abnormal results or if patient has not viewed results in their echart.  

## 2022-01-11 NOTE — Progress Notes (Signed)
Andres Brooks , 1975-08-15, 46 y.o., male MRN: [id number] Patient Care Team    Relationship Specialty Notifications Start End  Ma Hillock, DO PCP - General Family Medicine  03/03/15   Mansouraty, Telford Nab., MD Consulting Physician Gastroenterology  04/08/21   Star Age, MD Attending Physician Neurology  04/08/21    Comment: OSA  [name], MD  Endocrinology  04/08/21    Comment: thyroid    Chief Complaint  Patient presents with   left flank pain    About a week.     Subjective: Pt presents for an OV with complaints of left flank pain of 1 week duration.  Associated symptoms include muscle strain of his left thoracic area.  He has been taking Advil for discomfort and it has been effective.  He does not recall how he strained his back.  He reports he was working out in the yard in the garage prior to onset.  He reports the pain can occur anterior upper abdomen on the left and shoot towards his back.  He denies history of gallstones, fever, nausea, vomit or changes in bowel habits.  He does feel that his back felt warm to him.  He has not noticed any rash.  History of kidney stones, but does not feel like this is consistent with prior kidney stone discomfort.     04/08/2021    8:50 AM 10/16/2019    2:34 PM 03/29/2017    1:34 PM 05/02/2015    9:44 AM  Depression screen PHQ 2/9  Decreased Interest 0 0 0 0  Down, Depressed, Hopeless 0 1 0 0  PHQ - 2 Score 0 1 0 0    Allergies  Allergen Reactions   Ciprofloxacin Other (See Comments)    Elevated creatinine   Social History   Social History Narrative   Married, [place].   Has two daughters, Donnetta Simpers and Clide Dales.   [employer]. College education.   Drink caffeinated beverages. Uses herbal remedies. Takes a daily vitamin.   Wears his seatbelt, wears a bicycle helmet. Smoke alarm in the home. Firearms in the home.   Feels safe in his relationship.   Past Medical History:  Diagnosis Date   Chronic throat clearing  03/13/2018   GERD (gastroesophageal reflux disease)    Kidney stones 03/10/2011   Thyroid disease    Past Surgical History:  Procedure Laterality Date   COLONOSCOPY     no polyps   UPPER GASTROINTESTINAL ENDOSCOPY     VASECTOMY     Family History  Problem Relation Age of Onset   Breast cancer Mother    Hypertension Mother    Arthritis Mother    High Cholesterol Mother    Skin cancer Brother    Breast cancer Maternal Aunt    Heart disease Maternal Uncle 33       33    Colon cancer Neg Hx    Esophageal cancer Neg Hx    Inflammatory bowel disease Neg Hx    Liver disease Neg Hx    Pancreatic cancer Neg Hx    Rectal cancer Neg Hx    Stomach cancer Neg Hx    Colon polyps Neg Hx    Allergies as of 01/11/2022       Reactions   Ciprofloxacin Other (See Comments)   Elevated creatinine        Medication List        Accurate as of January 11, 2022  2:57 PM.  If you have any questions, ask your nurse or doctor.          STOP taking these medications    nystatin 100000 UNIT/ML suspension Commonly known as: MYCOSTATIN Stopped by: Howard Pouch, DO       TAKE these medications    ascorbic acid 500 MG tablet Commonly known as: VITAMIN C Take 500 mg by mouth daily.   B-12 1000 MCG Subl 1000 mcg sublingual daily   chlorhexidine 0.12 % solution Commonly known as: PERIDEX Use as directed 15 mLs in the mouth or throat 2 (two) times daily.   cyclobenzaprine 10 MG tablet Commonly known as: FLEXERIL Take 1 tablet (10 mg total) by mouth 3 (three) times daily as needed for muscle spasms. Started by: Howard Pouch, DO   levothyroxine 175 MCG tablet Commonly known as: SYNTHROID Take 1 tablet (175 mcg total) by mouth daily before breakfast.   omeprazole 40 MG capsule Commonly known as: PRILOSEC TAKE 1 CAPSULE(40 MG) BY MOUTH DAILY   Vitamin D3 25 MCG (1000 UT) Caps Take 1 capsule (1,000 Units total) by mouth daily.        All past medical history, surgical  history, allergies, family history, immunizations andmedications were updated in the EMR today and reviewed under the history and medication portions of their EMR.     ROS Negative, with the exception of above mentioned in HPI   Objective:  BP 129/80   Pulse 71   Temp 97.6 F (36.4 C)   Wt 226 lb 9.6 oz (102.8 kg)   SpO2 98%   BMI 27.58 kg/m  Body mass index is 27.58 kg/m. Physical Exam Vitals and nursing note reviewed.  Constitutional:      General: He is not in acute distress.    Appearance: Normal appearance. He is not ill-appearing, toxic-appearing or diaphoretic.  HENT:     Head: Normocephalic and atraumatic.     Mouth/Throat:     Mouth: Mucous membranes are moist.  Eyes:     General: No scleral icterus.       Right eye: No discharge.        Left eye: No discharge.     Extraocular Movements: Extraocular movements intact.     Pupils: Pupils are equal, round, and reactive to light.  Cardiovascular:     Rate and Rhythm: Normal rate and regular rhythm.     Heart sounds: No murmur heard. Pulmonary:     Effort: Pulmonary effort is normal. No respiratory distress.     Breath sounds: Normal breath sounds. No wheezing, rhonchi or rales.  Chest:     Chest wall: No tenderness.  Abdominal:     General: Abdomen is flat. There is no distension.     Palpations: Abdomen is soft.     Tenderness: There is no abdominal tenderness. There is no right CVA tenderness, left CVA tenderness, guarding or rebound.  Musculoskeletal:     Cervical back: Neck supple.  Lymphadenopathy:     Cervical: No cervical adenopathy.  Skin:    General: Skin is warm and dry.     Coloration: Skin is not jaundiced or pale.     Findings: No rash.  Neurological:     Mental Status: He is alert and oriented to person, place, and time. Mental status is at baseline.  Psychiatric:        Mood and Affect: Mood normal.        Behavior: Behavior normal.        Thought  Content: Thought content normal.         Judgment: Judgment normal.     No results found. No results found. Results for orders placed or performed in visit on 01/11/22 (from the past 24 hour(s))  POCT urinalysis dipstick     Status: None   Collection Time: 01/11/22  1:43 PM  Result Value Ref Range   Color, UA yellow    Clarity, UA clear    Glucose, UA Negative Negative   Bilirubin, UA neg    Ketones, UA neg    Spec Grav, UA 1.015 1.010 - 1.025   Blood, UA neg    pH, UA 7.0 5.0 - 8.0   Protein, UA Negative Negative   Urobilinogen, UA 0.2 0.2 or 1.0 E.U./dL   Nitrite, UA neg    Leukocytes, UA Negative Negative   Appearance     Odor      Assessment/Plan: Trayon Krantz is a 46 y.o. male present for OV for  Left flank pain Uncertain etiology of his discomfort.  Leaning towards rib strain/muscle strain.  I was unable to reproduce his discomfort today on exam.  No bony tenderness, therefore do not feel x-ray will be helpful.  He has no upper respiratory signs and lung exam is clear.  Considered pancreatitis or kidney stone, but neither of these seem to be consistent with exam. Will treat as muscle strain with Flexeril and OTC Advil.  Encouraged rest and heat application.   - POCT urinalysis dipstick - Urinalysis w microscopic + reflex cultur - Lipase - Comp Met (CMET) - CBC w/Diff Follow-up dependent upon lab results History of kidney stones Point of care urinalysis is negative for hematuria.  Normal urinalysis.  Reviewed expectations re: course of current medical issues. Discussed self-management of symptoms. Outlined signs and symptoms indicating need for more acute intervention. Patient verbalized understanding and all questions were answered. Patient received an After-Visit Summary.    Orders Placed This Encounter  Procedures   Urinalysis w microscopic + reflex cultur   Lipase   Comp Met (CMET)   CBC w/Diff   POCT urinalysis dipstick   Meds ordered this encounter  Medications   cyclobenzaprine (FLEXERIL)  10 MG tablet    Sig: Take 1 tablet (10 mg total) by mouth 3 (three) times daily as needed for muscle spasms.    Dispense:  30 tablet    Refill:  0   Referral Orders  No referral(s) requested today     Note is dictated utilizing voice recognition software. Although note has been proof read prior to signing, occasional typographical errors still can be missed. If any questions arise, please do not hesitate to call for verification.   electronically signed by:  Howard Pouch, DO  [place]

## 2022-01-12 LAB — URINALYSIS W MICROSCOPIC + REFLEX CULTURE
Bacteria, UA: NONE SEEN /HPF
Bilirubin Urine: NEGATIVE
Glucose, UA: NEGATIVE
Hgb urine dipstick: NEGATIVE
Hyaline Cast: NONE SEEN /LPF
Ketones, ur: NEGATIVE
Leukocyte Esterase: NEGATIVE
Nitrites, Initial: NEGATIVE
Protein, ur: NEGATIVE
RBC / HPF: NONE SEEN /HPF (ref 0–2)
Specific Gravity, Urine: 1.01 (ref 1.001–1.035)
Squamous Epithelial / HPF: NONE SEEN /HPF (ref <=5)
WBC, UA: NONE SEEN /HPF (ref 0–5)
pH: 8 (ref 5.0–8.0)

## 2022-01-12 LAB — CBC WITH DIFFERENTIAL/PLATELET
Absolute Monocytes: 525 cells/uL (ref 200–950)
Basophils Absolute: 53 cells/uL (ref 0–200)
Basophils Relative: 0.7 %
Eosinophils Absolute: 240 cells/uL (ref 15–500)
Eosinophils Relative: 3.2 %
HCT: 39.8 % (ref 38.5–50.0)
Hemoglobin: 14 g/dL (ref 13.2–17.1)
Lymphs Abs: 3135 cells/uL (ref 850–3900)
MCH: 29.4 pg (ref 27.0–33.0)
MCHC: 35.2 g/dL (ref 32.0–36.0)
MCV: 83.4 fL (ref 80.0–100.0)
MPV: 10.2 fL (ref 7.5–12.5)
Monocytes Relative: 7 %
Neutro Abs: 3548 cells/uL (ref 1500–7800)
Neutrophils Relative %: 47.3 %
Platelets: 276 10*3/uL (ref 140–400)
RBC: 4.77 10*6/uL (ref 4.20–5.80)
RDW: 13.1 % (ref 11.0–15.0)
Total Lymphocyte: 41.8 %
WBC: 7.5 10*3/uL (ref 3.8–10.8)

## 2022-01-12 LAB — COMPREHENSIVE METABOLIC PANEL
ALT: 28 U/L (ref 0–53)
AST: 21 U/L (ref 0–37)
Albumin: 4.7 g/dL (ref 3.5–5.2)
Alkaline Phosphatase: 60 U/L (ref 39–117)
BUN: 16 mg/dL (ref 6–23)
CO2: 26 mEq/L (ref 19–32)
Calcium: 9.6 mg/dL (ref 8.4–10.5)
Chloride: 105 mEq/L (ref 96–112)
Creatinine, Ser: 1.05 mg/dL (ref 0.40–1.50)
GFR: 86.41 mL/min (ref >60.00)
Glucose, Bld: 81 mg/dL (ref 70–99)
Potassium: 4.7 mEq/L (ref 3.5–5.1)
Sodium: 142 mEq/L (ref 135–145)
Total Bilirubin: 0.5 mg/dL (ref 0.2–1.2)
Total Protein: 6.8 g/dL (ref 6.0–8.3)

## 2022-01-12 LAB — LIPASE: Lipase: 24 U/L (ref 11.0–59.0)

## 2022-01-12 LAB — NO CULTURE INDICATED

## 2022-04-09 ENCOUNTER — Ambulatory Visit (INDEPENDENT_AMBULATORY_CARE_PROVIDER_SITE_OTHER): Payer: BC Managed Care – PPO | Admitting: Family Medicine

## 2022-04-09 ENCOUNTER — Encounter: Payer: Self-pay | Admitting: Family Medicine

## 2022-04-09 ENCOUNTER — Other Ambulatory Visit: Payer: Self-pay | Admitting: Family Medicine

## 2022-04-09 VITALS — BP 124/88 | HR 103 | Temp 98.4°F | Ht 76.77 in | Wt 226.8 lb

## 2022-04-09 DIAGNOSIS — E034 Atrophy of thyroid (acquired): Secondary | ICD-10-CM

## 2022-04-09 DIAGNOSIS — E559 Vitamin D deficiency, unspecified: Secondary | ICD-10-CM

## 2022-04-09 DIAGNOSIS — Z0000 Encounter for general adult medical examination without abnormal findings: Secondary | ICD-10-CM

## 2022-04-09 DIAGNOSIS — E538 Deficiency of other specified B group vitamins: Secondary | ICD-10-CM | POA: Diagnosis not present

## 2022-04-09 DIAGNOSIS — Z131 Encounter for screening for diabetes mellitus: Secondary | ICD-10-CM | POA: Diagnosis not present

## 2022-04-09 DIAGNOSIS — G479 Sleep disorder, unspecified: Secondary | ICD-10-CM

## 2022-04-09 DIAGNOSIS — E781 Pure hyperglyceridemia: Secondary | ICD-10-CM

## 2022-04-09 LAB — COMPREHENSIVE METABOLIC PANEL
ALT: 40 U/L (ref 0–53)
AST: 29 U/L (ref 0–37)
Albumin: 4.6 g/dL (ref 3.5–5.2)
Alkaline Phosphatase: 67 U/L (ref 39–117)
BUN: 14 mg/dL (ref 6–23)
CO2: 27 mEq/L (ref 19–32)
Calcium: 9.4 mg/dL (ref 8.4–10.5)
Chloride: 104 mEq/L (ref 96–112)
Creatinine, Ser: 1.05 mg/dL (ref 0.40–1.50)
GFR: 86.27 mL/min (ref >60.00)
Glucose, Bld: 87 mg/dL (ref 70–99)
Potassium: 4.3 mEq/L (ref 3.5–5.1)
Sodium: 140 mEq/L (ref 135–145)
Total Bilirubin: 0.5 mg/dL (ref 0.2–1.2)
Total Protein: 7 g/dL (ref 6.0–8.3)

## 2022-04-09 LAB — LIPID PANEL
Cholesterol: 160 mg/dL (ref 0–200)
HDL: 33.8 mg/dL — ABNORMAL LOW (ref >39.00)
LDL Cholesterol: 94 mg/dL (ref 0–99)
NonHDL: 126.13
Total CHOL/HDL Ratio: 5
Triglycerides: 159 mg/dL — ABNORMAL HIGH (ref 0.0–149.0)
VLDL: 31.8 mg/dL (ref 0.0–40.0)

## 2022-04-09 LAB — T4, FREE: Free T4: 0.85 ng/dL (ref 0.60–1.60)

## 2022-04-09 LAB — HEMOGLOBIN A1C: Hgb A1c MFr Bld: 5.4 % (ref 4.6–6.5)

## 2022-04-09 LAB — VITAMIN B12: Vitamin B-12: 270 pg/mL (ref 211–911)

## 2022-04-09 LAB — VITAMIN D 25 HYDROXY (VIT D DEFICIENCY, FRACTURES): VITD: 43.89 ng/mL (ref 30.00–100.00)

## 2022-04-09 LAB — TSH: TSH: 0.41 u[IU]/mL (ref 0.35–5.50)

## 2022-04-09 MED ORDER — LEVOTHYROXINE SODIUM 175 MCG PO TABS: 175.0000 ug | ORAL_TABLET | Freq: Every day | ORAL | 4 refills | Status: DC

## 2022-04-09 MED ORDER — TRAZODONE HCL 50 MG PO TABS: 25.0000 mg | ORAL_TABLET | Freq: Every evening | ORAL | 0 refills | Status: DC | PRN

## 2022-04-09 NOTE — Patient Instructions (Addendum)
Return in about 1 year (around 04/11/2023) for cpe (20 min).        Great to see you today.  I have refilled the medication(s) we provide.   If labs were collected, we will inform you of lab results once received either by echart message or telephone call.   - echart message- for normal results that have been seen by the patient already.   - telephone call: abnormal results or if patient has not viewed results in their echart.

## 2022-04-09 NOTE — Progress Notes (Signed)
Labs for thyroid are normal.  Refills provided today.

## 2022-04-09 NOTE — Progress Notes (Signed)
Patient ID: Andres Brooks, male  DOB: 06-08-1975, 47 y.o.   MRN: [id number] Patient Care Team    Relationship Specialty Notifications Start End  Ma Hillock, DO PCP - General Family Medicine  03/03/15   Mansouraty, Telford Nab., MD Consulting Physician Gastroenterology  04/08/21   Star Age, MD Attending Physician Neurology  04/08/21    Comment: OSA  [name], MD  Endocrinology  04/08/21    Comment: thyroid    Chief Complaint  Patient presents with   Annual Exam    Subjective: Andres Brooks is a 47 y.o. male present for CPE and Chronic Conditions/illness Management  All past medical history, surgical history, allergies, family history, immunizations, medications and social history were updated in the electronic medical record today. All recent labs, ED visits and hospitalizations within the last year were reviewed.  Health maintenance:  Colon cancer screen: no FHx, screen 47. Immunizations: UTD 2023 tdap, flu declined Infectious disease screen:HIV completed. Hep c completed PSA: screen at 52, no FHX No results found for: "PSA", pt was counseled on prostate cancer screenings.  Assistive device: none Oxygen [id number] Patient has a Dental home. Hospitalizations/ED visits: reviewed  Sleep disturbance: Patient reports he has had difficulty sleeping for some time. He is able to fall asleep most nights, but he wakes in the middle of the night and had difficulty getting back to sleep. He has thoughts, that he states makes not sense to have about tasks or conversations etc. He reports he is not anxious about anything that he is aware of. He is extremely tired and has only average about 2-3 hours a sleep over the last few weeks.       04/09/2022    8:08 AM 04/08/2021    8:50 AM 10/16/2019    2:34 PM 03/29/2017    1:34 PM 05/02/2015    9:44 AM  Depression screen PHQ 2/9  Decreased Interest 0 0 0 0 0  Down, Depressed, Hopeless 1 0 1 0 0  PHQ - 2 Score 1 0 1 0 0  Altered  sleeping 3      Tired, decreased energy 3      Change in appetite 0      Feeling bad or failure about yourself  0      Trouble concentrating 0      Moving slowly or fidgety/restless 0      Suicidal thoughts 0      PHQ-9 Score 7      Difficult doing work/chores Somewhat difficult          04/09/2022    8:08 AM 10/16/2019    2:34 PM  GAD 7 : Generalized Anxiety Score  Nervous, Anxious, on Edge 1 2  Control/stop worrying 2 2  Worry too much - different things 2 3  Trouble relaxing 2 1  Restless 0 0  Easily annoyed or irritable 0 0  Afraid - awful might happen 0 0  Total GAD 7 Score 7 8  Anxiety Difficulty Somewhat difficult Somewhat difficult          04/09/2022    8:08 AM 10/16/2019    2:33 PM 05/02/2015    9:44 AM  Fall Risk   Falls in the past year? 0 0 No  Number falls in past yr: 0 0   Injury with Fall? 0 0   Follow up  Falls evaluation completed    Immunization History  Administered Date(s) Administered   PFIZER(Purple Top)SARS-COV-2 Vaccination 04/26/2019,  05/17/2019   Tdap 08/14/2008, 02/23/2010, 04/14/2021   Past Medical History:  Diagnosis Date   Chronic throat clearing 03/13/2018   GERD (gastroesophageal reflux disease)    Kidney stones 03/10/2011   Thyroid disease    Allergies  Allergen Reactions   Ciprofloxacin Other (See Comments)    Elevated creatinine   Past Surgical History:  Procedure Laterality Date   COLONOSCOPY     no polyps   UPPER GASTROINTESTINAL ENDOSCOPY     VASECTOMY     Family History  Problem Relation Age of Onset   Breast cancer Mother    Hypertension Mother    Arthritis Mother    High Cholesterol Mother    Skin cancer Brother    Breast cancer Maternal Aunt    Heart disease Maternal Uncle 70       33    Colon cancer Neg Hx    Esophageal cancer Neg Hx    Inflammatory bowel disease Neg Hx    Liver disease Neg Hx    Pancreatic cancer Neg Hx    Rectal cancer Neg Hx    Stomach cancer Neg Hx    Colon polyps Neg Hx     Social History   Social History Narrative   Married, [employer].   Has two daughters, Donnetta Simpers and Clide Dales.   [employer]. College education.   Drink caffeinated beverages. Uses herbal remedies. Takes a daily vitamin.   Wears his seatbelt, wears a bicycle helmet. Smoke alarm in the home. Firearms in the home.   Feels safe in his relationship.    Allergies as of 04/09/2022       Reactions   Ciprofloxacin Other (See Comments)   Elevated creatinine        Medication List        Accurate as of April 09, 2022  9:43 AM. If you have any questions, ask your nurse or doctor.          STOP taking these medications    chlorhexidine 0.12 % solution Commonly known as: PERIDEX Stopped by: Howard Pouch, DO   cyclobenzaprine 10 MG tablet Commonly known as: FLEXERIL Stopped by: Howard Pouch, DO       TAKE these medications    ascorbic acid 500 MG tablet Commonly known as: VITAMIN C Take 500 mg by mouth daily.   B-12 1000 MCG Subl 1000 mcg sublingual daily   levothyroxine 175 MCG tablet Commonly known as: SYNTHROID Take 1 tablet (175 mcg total) by mouth daily before breakfast.   omeprazole 40 MG capsule Commonly known as: PRILOSEC Take 40 mg by mouth as needed. What changed: Another medication with the same name was removed. Continue taking this medication, and follow the directions you see here. Changed by: Howard Pouch, DO   traZODone 50 MG tablet Commonly known as: DESYREL Take 0.5-2 tablets (25-100 mg total) by mouth at bedtime as needed for sleep. Started by: Howard Pouch, DO   Vitamin D3 25 MCG (1000 UT) capsule Generic drug: Cholecalciferol Take 1 capsule (1,000 Units total) by mouth daily.       All past medical history, surgical history, allergies, family history, immunizations andmedications were updated in the EMR today and reviewed under the history and medication portions of their EMR.       ROS 14 pt review of systems performed and negative  (unless mentioned in an HPI)  Objective: BP 124/88   Pulse (!) 103   Temp 98.4 F (36.9 C)   Ht 6' 4.77" (1.95 m)  Wt 226 lb 12.8 oz (102.9 kg)   SpO2 98%   BMI 27.05 kg/m  Physical Exam Constitutional:      General: He is not in acute distress.    Appearance: Normal appearance. He is not ill-appearing, toxic-appearing or diaphoretic.  HENT:     Head: Normocephalic and atraumatic.     Right Ear: Tympanic membrane, ear canal and external ear normal. There is no impacted cerumen.     Left Ear: Tympanic membrane, ear canal and external ear normal. There is no impacted cerumen.     Nose: Nose normal. No congestion or rhinorrhea.     Mouth/Throat:     Mouth: Mucous membranes are moist.     Pharynx: Oropharynx is clear. No oropharyngeal exudate or posterior oropharyngeal erythema.  Eyes:     General: No scleral icterus.       Right eye: No discharge.        Left eye: No discharge.     Extraocular Movements: Extraocular movements intact.     Pupils: Pupils are equal, round, and reactive to light.  Cardiovascular:     Rate and Rhythm: Normal rate and regular rhythm.     Pulses: Normal pulses.     Heart sounds: Normal heart sounds. No murmur heard.    No friction rub. No gallop.  Pulmonary:     Effort: Pulmonary effort is normal. No respiratory distress.     Breath sounds: Normal breath sounds. No stridor. No wheezing, rhonchi or rales.  Chest:     Chest wall: No tenderness.  Abdominal:     General: Abdomen is flat. Bowel sounds are normal. There is no distension.     Palpations: Abdomen is soft. There is no mass.     Tenderness: There is no abdominal tenderness. There is no right CVA tenderness, left CVA tenderness, guarding or rebound.     Hernia: No hernia is present.  Musculoskeletal:        General: No swelling or tenderness. Normal range of motion.     Cervical back: Normal range of motion and neck supple.     Right lower leg: No edema.     Left lower leg: No edema.   Lymphadenopathy:     Cervical: No cervical adenopathy.  Skin:    General: Skin is warm and dry.     Coloration: Skin is not jaundiced.     Findings: No bruising, lesion or rash.  Neurological:     General: No focal deficit present.     Mental Status: He is alert and oriented to person, place, and time. Mental status is at baseline.     Cranial Nerves: No cranial nerve deficit.     Sensory: No sensory deficit.     Motor: No weakness.     Coordination: Coordination normal.     Gait: Gait normal.     Deep Tendon Reflexes: Reflexes normal.  Psychiatric:        Mood and Affect: Mood normal.        Behavior: Behavior normal.        Thought Content: Thought content normal.        Judgment: Judgment normal.     No results found.  Assessment/plan: Cedar Nardiello is a 47 y.o. male present for CPE and Chronic Conditions/illness Management Hypertriglyceridemia/Overweight (BMI 25.0-29.9) Routine exercise and dietary modifications recommended Cmp, tsh, lipids collected today B12 deficiency/Vitamin D deficiency B12 and vit d collected today  OSA on CPAP Managed by [facility]  Sleep  disturbance: We discussed medication options today,  He is agreeable to try trazodone taper. Start trazodone 25-100 mg qhs . We are checking tsh as well- could be over supplemented as cause.  F/u 3 mos- sooner if needed.   Routine general medical examination at a health care facility Patient was encouraged to exercise greater than 150 minutes a week. Patient was encouraged to choose a diet filled with fresh fruits and vegetables, and lean meats. AVS provided to patient today for education/recommendation on gender specific health and safety maintenance. Colon cancer screen: no FHx, screen 47. Immunizations: UTD 2023 tdap, flu declined Infectious disease screen:HIV completed. Hep c completed PSA: screen at 52, no FHX No results found for: "PSA", pt was counseled on prostate cancer screenings.  Return in about 1  year (around 04/11/2023) for cpe (20 min) and 3 mos on CMC.  Orders Placed This Encounter  Procedures   Comprehensive metabolic panel   Hemoglobin A1c   Lipid panel   TSH   T4, free   Vitamin D (25 hydroxy)   B12    Meds ordered this encounter  Medications   traZODone (DESYREL) 50 MG tablet    Sig: Take 0.5-2 tablets (25-100 mg total) by mouth at bedtime as needed for sleep.    Dispense:  180 tablet    Refill:  0   Referral Orders  No referral(s) requested today     Note is dictated utilizing voice recognition software. Although note has been proof read prior to signing, occasional typographical errors still can be missed. If any questions arise, please do not hesitate to call for verification.  Electronically signed by: Howard Pouch, DO [place]

## 2022-04-18 ENCOUNTER — Other Ambulatory Visit: Payer: Self-pay | Admitting: Family Medicine

## 2022-06-10 ENCOUNTER — Other Ambulatory Visit: Payer: Self-pay | Admitting: Family Medicine

## 2022-06-14 DIAGNOSIS — E049 Nontoxic goiter, unspecified: Secondary | ICD-10-CM | POA: Diagnosis not present

## 2022-06-14 DIAGNOSIS — E039 Hypothyroidism, unspecified: Secondary | ICD-10-CM | POA: Diagnosis not present

## 2022-06-14 DIAGNOSIS — Z1330 Encounter for screening examination for mental health and behavioral disorders, unspecified: Secondary | ICD-10-CM | POA: Diagnosis not present

## 2022-06-30 ENCOUNTER — Other Ambulatory Visit: Payer: Self-pay | Admitting: Family Medicine

## 2022-07-09 ENCOUNTER — Ambulatory Visit: Payer: BC Managed Care – PPO | Admitting: Family Medicine

## 2022-11-03 ENCOUNTER — Other Ambulatory Visit: Payer: Self-pay | Admitting: Family Medicine

## 2022-11-19 ENCOUNTER — Telehealth: Payer: Self-pay | Admitting: Family Medicine

## 2022-11-19 DIAGNOSIS — K219 Gastro-esophageal reflux disease without esophagitis: Secondary | ICD-10-CM

## 2022-11-19 MED ORDER — OMEPRAZOLE 40 MG PO CPDR: 40.0000 mg | DELAYED_RELEASE_CAPSULE | Freq: Every day | ORAL | 3 refills | Status: DC

## 2022-11-19 NOTE — Telephone Encounter (Signed)
Prescription Request  11/19/2022  LOV: 04/09/2022 Pharmacy is [facility] order pharmacy  What is the name of the medication or equipment? omeprazole (PRILOSEC) 40 MG capsule   Have you contacted your pharmacy to request a refill? Yes   Which pharmacy would you like this sent to?  [id number] - [place], [place] - [address] [place] [address] Phone: [phone] Fax: [phone]  [email or web address] - [place], [place] - [address] [place] [address] Phone: [phone] Fax: [phone]    Patient notified that their request is being sent to the clinical staff for review and that they should receive a response within 2 business days.   Please advise at Mobile [phone] (mobile)

## 2022-11-24 ENCOUNTER — Telehealth: Payer: Self-pay

## 2022-11-24 NOTE — Telephone Encounter (Signed)
Pharmacy Patient Advocate Encounter   Received notification from CoverMyMeds that prior authorization for Omeprazole 40MG  dr capsules is required/requested.   Insurance verification completed.   The patient is insured through [facility] .   Per test claim: PA required; PA started via CoverMyMeds. KEY [id number] . Waiting for clinical questions to populate.

## 2022-11-25 ENCOUNTER — Other Ambulatory Visit (HOSPITAL_COMMUNITY): Payer: Self-pay

## 2022-11-25 NOTE — Telephone Encounter (Signed)
Clinical questions answered. PA submitted

## 2022-11-26 NOTE — Telephone Encounter (Signed)
Pharmacy Patient Advocate Encounter  Received notification from [facility] that Prior Authorization for Omeprazole 40MG  dr capsules has been DENIED.  Full denial letter will be uploaded to the media tab. See denial reason below.   PA #/Case ID/Reference #:  [id number]    DENIAL REASON: This health benefit plan does not cover the following services, supplies, drugs or charges: Any drug that is therapeutically equivalent to an over-the-counter drug where the over-the-counter products contain the same active ingredients as the prescription product at the same, or similar, strengths. -OR- Drugs that are Purchased over-the-counter, unless specifically listed as a covered drug in the formulary and a written prescription is provided

## 2022-12-24 DIAGNOSIS — R21 Rash and other nonspecific skin eruption: Secondary | ICD-10-CM | POA: Diagnosis not present

## 2023-04-04 ENCOUNTER — Other Ambulatory Visit (HOSPITAL_COMMUNITY): Payer: Self-pay

## 2023-04-04 ENCOUNTER — Telehealth: Payer: Self-pay

## 2023-04-04 NOTE — Telephone Encounter (Signed)
 Pharmacy Patient Advocate Encounter   Received notification from CoverMyMeds that prior authorization for Omeprazole  40MG  dr capsules is required/requested.   Insurance verification completed.   The patient is insured through [facility] .   Per test claim:  OTC equivalent medications are excluded from plan benefit.

## 2023-06-03 ENCOUNTER — Other Ambulatory Visit (HOSPITAL_COMMUNITY): Payer: Self-pay

## 2023-06-09 ENCOUNTER — Other Ambulatory Visit: Payer: Self-pay | Admitting: Family Medicine

## 2023-06-09 DIAGNOSIS — K219 Gastro-esophageal reflux disease without esophagitis: Secondary | ICD-10-CM

## 2023-06-14 ENCOUNTER — Other Ambulatory Visit (HOSPITAL_COMMUNITY): Payer: Self-pay

## 2023-06-16 DIAGNOSIS — E039 Hypothyroidism, unspecified: Secondary | ICD-10-CM | POA: Diagnosis not present

## 2023-08-30 ENCOUNTER — Other Ambulatory Visit: Payer: Self-pay

## 2023-08-30 ENCOUNTER — Other Ambulatory Visit: Payer: Self-pay | Admitting: Family Medicine

## 2023-08-30 MED ORDER — LEVOTHYROXINE SODIUM 175 MCG PO TABS: 175.0000 ug | ORAL_TABLET | Freq: Every day | ORAL | 0 refills | Status: DC

## 2023-08-30 NOTE — Telephone Encounter (Signed)
 Copied from CRM [id number]. Topic: Clinical - Medication Refill >> Aug 30, 2023  9:07 AM Deaijah H wrote: Medication: levothyroxine  (SYNTHROID ) 175 MCG tablet  Has the patient contacted their pharmacy? Yes (Agent: If no, request that the patient contact the pharmacy for the refill. If patient does not wish to contact the pharmacy document the reason why and proceed with request.) (Agent: If yes, when and what did the pharmacy advise?) Advised to reach out to provider  This is the patient's preferred pharmacy:  [facility] [address] [place], [place] [address] Phone: [phone] Fax: [phone]  Is this the correct pharmacy for this prescription? Yes If no, delete pharmacy and type the correct one.   Has the prescription been filled recently? No  Is the patient out of the medication? Yes  Has the patient been seen for an appointment in the last year OR does the patient have an upcoming appointment? Yes  Can we respond through MyChart? Yes  Agent: Please be advised that Rx refills may take up to 3 business days. We ask that you follow-up with your pharmacy.

## 2023-12-25 ENCOUNTER — Other Ambulatory Visit: Payer: Self-pay | Admitting: Family Medicine

## 2023-12-25 DIAGNOSIS — K219 Gastro-esophageal reflux disease without esophagitis: Secondary | ICD-10-CM

## 2023-12-26 ENCOUNTER — Telehealth: Payer: Self-pay

## 2023-12-26 ENCOUNTER — Other Ambulatory Visit (HOSPITAL_COMMUNITY): Payer: Self-pay

## 2023-12-26 NOTE — Telephone Encounter (Signed)
 Pharmacy Patient Advocate Encounter   Received notification from CoverMyMeds that prior authorization for Omeprazole  40MG  dr capsules   is required/requested.   Insurance verification completed.   The patient is insured through [facility].   Per test claim: Per test claim, medication is not covered due to plan/benefit exclusion, PA not submitted at this time [employer] HAS SENT A EXCLUSION/NOT FOROMULARY EXCEPTION I HAVE FILL OUT SENT TO PLAN  KEY ([id number])

## 2023-12-27 NOTE — Telephone Encounter (Signed)
 Pharmacy Patient Advocate Encounter  Received notification from [facility] that Prior Authorization for Omeprazole  40MG  dr capsules   has been DENIED.  See denial reason below. No denial letter attached in CMM. Will attach denial letter to Media tab once received.  PLEASE BE ADVISED DENIED PLEASE GET OVER THE COUNTER PER PLAN  Denied. This health benefit plan does not cover the following services, supplies, drugs or charges: Any drug that is therapeutically equivalent to an over-the-counter drug where the over-the-counter products contain the same active ingredients as the prescription product at the same, or similar, strengths. -OR- Drugs that are purchased over-the-counter, unless specifically listed as a covered drug in the formulary and a written prescription is provided.    PA #/Case ID/Reference #: [id number]

## 2024-01-31 ENCOUNTER — Telehealth: Payer: Self-pay | Admitting: Family Medicine

## 2024-01-31 NOTE — Telephone Encounter (Unsigned)
 Copied from CRM #[id number]. Topic: Clinical - Medication Refill >> Jan 31, 2024  4:28 PM Ashley R wrote: Medication: omeprazole  (PRILOSEC) 40 MG capsule  Has the patient contacted their pharmacy? Yes, refill needed before end of the year  This is the patient's preferred pharmacy:   [facility] [address], [place] - [address] [place] [address] Phone: [phone] Fax: [phone]  Is this the correct pharmacy for this prescription? Yes If no, delete pharmacy and type the correct one.   Has the prescription been filled recently? Yes  Is the patient out of the medication? Yes  Has the patient been seen for an appointment in the last year OR does the patient have an upcoming appointment? Yes  Can we respond through MyChart? No, callback preferred.  Agent: Please be advised that Rx refills may take up to 3 business days. We ask that you follow-up with your pharmacy.   ----------------------------------------------------------------------- From previous Reason for Contact - Medication Refill: Medication:  omeprazole  (PRILOSEC) 40 MG capsule   Has the patient contacted their pharmacy?   (Agent: If no, request that the patient contact the pharmacy for the refill. If patient does not wish to contact the pharmacy document the reason why and proceed with request.) (Agent: If yes, when and what did the pharmacy advise?)  This is the patient's preferred pharmacy:  [id number] - [place], [place] - [address] [place] [address] [place] [address] [place] [address] Phone: [phone] Fax: [phone]  [email or web address] - [place], [place] - [address] [address] Phone: [phone] Fax: [phone]  [facility] [address] [place] - [address] AT [facility] [address] & [place] [phone] [place] [address] Phone: [phone] Fax: [phone]  Is this the correct pharmacy for this prescription?   If no, delete pharmacy and type the correct one.   Has the prescription been filled recently?    Is the patient out of the medication?    Has the patient been seen for an appointment in the last year OR does the patient have an upcoming appointment?    Can we respond through MyChart?    Agent: Please be advised that Rx refills may take up to 3 business days. We ask that you follow-up with your pharmacy.

## 2024-02-03 ENCOUNTER — Encounter: Payer: Self-pay | Admitting: Family Medicine

## 2024-02-03 ENCOUNTER — Ambulatory Visit: Admitting: Family Medicine

## 2024-02-03 VITALS — BP 122/80 | HR 75 | Temp 97.9°F | Wt 236.0 lb

## 2024-02-03 DIAGNOSIS — E559 Vitamin D deficiency, unspecified: Secondary | ICD-10-CM | POA: Diagnosis not present

## 2024-02-03 DIAGNOSIS — Z23 Encounter for immunization: Secondary | ICD-10-CM | POA: Diagnosis not present

## 2024-02-03 DIAGNOSIS — E034 Atrophy of thyroid (acquired): Secondary | ICD-10-CM | POA: Diagnosis not present

## 2024-02-03 DIAGNOSIS — Z5181 Encounter for therapeutic drug level monitoring: Secondary | ICD-10-CM | POA: Diagnosis not present

## 2024-02-03 DIAGNOSIS — Z1211 Encounter for screening for malignant neoplasm of colon: Secondary | ICD-10-CM | POA: Diagnosis not present

## 2024-02-03 DIAGNOSIS — G479 Sleep disorder, unspecified: Secondary | ICD-10-CM | POA: Insufficient documentation

## 2024-02-03 DIAGNOSIS — E663 Overweight: Secondary | ICD-10-CM

## 2024-02-03 DIAGNOSIS — Z79899 Other long term (current) drug therapy: Secondary | ICD-10-CM | POA: Diagnosis not present

## 2024-02-03 DIAGNOSIS — E781 Pure hyperglyceridemia: Secondary | ICD-10-CM | POA: Diagnosis not present

## 2024-02-03 DIAGNOSIS — E538 Deficiency of other specified B group vitamins: Secondary | ICD-10-CM

## 2024-02-03 DIAGNOSIS — K2100 Gastro-esophageal reflux disease with esophagitis, without bleeding: Secondary | ICD-10-CM | POA: Diagnosis not present

## 2024-02-03 DIAGNOSIS — K219 Gastro-esophageal reflux disease without esophagitis: Secondary | ICD-10-CM

## 2024-02-03 MED ORDER — OMEPRAZOLE 40 MG PO CPDR: 40.0000 mg | DELAYED_RELEASE_CAPSULE | Freq: Every day | ORAL | 3 refills | Status: AC

## 2024-02-03 NOTE — Progress Notes (Unsigned)
 Patient ID: Andres Brooks, male  DOB: 08-20-1975, 48 y.o.   MRN: [id number] Patient Care Team    Relationship Specialty Notifications Start End  Catherine Charlies LABOR, DO PCP - General Family Medicine  03/03/15   Mansouraty, Aloha Raddle., MD Consulting Physician Gastroenterology  04/08/21   Buck Saucer, MD Attending Physician Neurology  04/08/21    Comment: OSA  Standley Dorothyann LABOR, MD  Endocrinology  04/08/21    Comment: thyroid     Chief Complaint  Patient presents with   Gastroesophageal Reflux    Subjective: Braydin Aloi is a 48 y.o. male present for  Chronic Conditions/illness Management All past medical history, surgical history, allergies, family history, immunizations, medications and social history were updated in the electronic medical record today. All recent labs, ED visits and hospitalizations within the last year were reviewed.    GERD: Patient reports compliance with omeprazole  40 mg mg daily.  Patient is unable to taper completely off medications without reoccurrence of symptoms.   patient is due for colon cancer screening    02/03/2024    8:21 AM 04/09/2022    8:08 AM 04/08/2021    8:50 AM 10/16/2019    2:34 PM 03/29/2017    1:34 PM  Depression screen PHQ 2/9  Decreased Interest 0 0 0 0 0  Down, Depressed, Hopeless 0 1 0 1 0  PHQ - 2 Score 0 1 0 1 0  Altered sleeping 0 3     Tired, decreased energy 0 3     Change in appetite 0 0     Feeling bad or failure about yourself  0 0     Trouble concentrating 0 0     Moving slowly or fidgety/restless 0 0     Suicidal thoughts 0 0     PHQ-9 Score 0 7      Difficult doing work/chores Not difficult at all Somewhat difficult        Data saved with a previous flowsheet row definition      02/03/2024    8:21 AM 04/09/2022    8:08 AM 10/16/2019    2:34 PM  GAD 7 : Generalized Anxiety Score  Nervous, Anxious, on Edge 0 1 2  Control/stop worrying 0 2 2  Worry too much - different things 0 2 3  Trouble relaxing 0 2 1  Restless  0 0 0  Easily annoyed or irritable 0 0 0  Afraid - awful might happen 0 0 0  Total GAD 7 Score 0 7 8  Anxiety Difficulty Not difficult at all Somewhat difficult Somewhat difficult          02/03/2024    8:21 AM 04/09/2022    8:08 AM 10/16/2019    2:33 PM 05/02/2015    9:44 AM  Fall Risk   Falls in the past year? 0 0 0 No   Number falls in past yr: 0 0 0   Injury with Fall? 0 0  0    Risk for fall due to : No Fall Risks     Follow up Falls evaluation completed  Falls evaluation completed       Data saved with a previous flowsheet row definition   Immunization History  Administered Date(s) Administered   PFIZER(Purple Top)SARS-COV-2 Vaccination 04/26/2019, 05/17/2019   Tdap 08/14/2008, 02/23/2010, 04/14/2021   Past Medical History:  Diagnosis Date   Chronic throat clearing 03/13/2018   GERD (gastroesophageal reflux disease)    Kidney stones 03/10/2011  Sleep disturbance 02/03/2024   Thyroid  disease    Allergies  Allergen Reactions   Ciprofloxacin Other (See Comments)    Elevated creatinine   Past Surgical History:  Procedure Laterality Date   COLONOSCOPY     no polyps   UPPER GASTROINTESTINAL ENDOSCOPY     VASECTOMY     Family History  Problem Relation Age of Onset   Breast cancer Mother    Hypertension Mother    Arthritis Mother    High Cholesterol Mother    Skin cancer Brother    Breast cancer Maternal Aunt    Heart disease Maternal Uncle 54       33    Colon cancer Neg Hx    Esophageal cancer Neg Hx    Inflammatory bowel disease Neg Hx    Liver disease Neg Hx    Pancreatic cancer Neg Hx    Rectal cancer Neg Hx    Stomach cancer Neg Hx    Colon polyps Neg Hx    Social History   Social History Narrative   Married, Baldwin.   Has two daughters, Kaylee and Karrington.   [employer]. College education.   Drink caffeinated beverages. Uses herbal remedies. Takes a daily vitamin.   Wears his seatbelt, wears a bicycle helmet. Smoke alarm in the home.  Firearms in the home.   Feels safe in his relationship.    Allergies as of 02/03/2024       Reactions   Ciprofloxacin Other (See Comments)   Elevated creatinine        Medication List        Accurate as of February 03, 2024 11:59 PM. If you have any questions, ask your nurse or doctor.          STOP taking these medications    traZODone  50 MG tablet Commonly known as: DESYREL  Stopped by: Charlies Bellini, DO       TAKE these medications    ascorbic acid 500 MG tablet Commonly known as: VITAMIN C Take 500 mg by mouth daily.   B-12 1000 MCG Subl 1000 mcg sublingual daily   levothyroxine  175 MCG tablet Commonly known as: SYNTHROID  Take 175 mcg by mouth daily before breakfast. What changed: Another medication with the same name was removed. Continue taking this medication, and follow the directions you see here. Changed by: Charlies Bellini, DO   omeprazole  40 MG capsule Commonly known as: PRILOSEC Take 1 capsule (40 mg total) by mouth daily. What changed: Another medication with the same name was removed. Continue taking this medication, and follow the directions you see here. Changed by: Charlies Bellini, DO   Vitamin D3 25 MCG (1000 UT) Caps Take 1 capsule (1,000 Units total) by mouth daily.       All past medical history, surgical history, allergies, family history, immunizations andmedications were updated in the EMR today and reviewed under the history and medication portions of their EMR.       Review of Systems  Constitutional: Negative.   HENT: Negative.    Eyes: Negative.   Respiratory: Negative.    Cardiovascular: Negative.   Gastrointestinal: Negative.   Genitourinary: Negative.   Musculoskeletal: Negative.   Skin: Negative.   Neurological: Negative.   Endo/Heme/Allergies: Negative.   Psychiatric/Behavioral: Negative.    All other systems reviewed and are negative.  14 pt review of systems performed and negative (unless mentioned in an  HPI)  Objective: BP 122/80   Pulse 75   Temp 97.9 F (36.6 C)  Wt 236 lb (107 kg)   SpO2 96%   BMI 28.15 kg/m  Physical Exam Vitals and nursing note reviewed. Exam conducted with a chaperone present.  Constitutional:      General: He is not in acute distress.    Appearance: Normal appearance. He is not ill-appearing, toxic-appearing or diaphoretic.  HENT:     Head: Normocephalic and atraumatic.  Eyes:     General: No scleral icterus.       Right eye: No discharge.        Left eye: No discharge.     Extraocular Movements: Extraocular movements intact.     Pupils: Pupils are equal, round, and reactive to light.  Cardiovascular:     Rate and Rhythm: Normal rate and regular rhythm.     Heart sounds: No murmur heard. Pulmonary:     Effort: Pulmonary effort is normal. No respiratory distress.     Breath sounds: Normal breath sounds. No wheezing, rhonchi or rales.  Musculoskeletal:     Right lower leg: No edema.     Left lower leg: No edema.  Skin:    General: Skin is warm.     Findings: No rash.  Neurological:     Mental Status: He is alert and oriented to person, place, and time. Mental status is at baseline.  Psychiatric:        Mood and Affect: Mood normal.        Behavior: Behavior normal.        Thought Content: Thought content normal.        Judgment: Judgment normal.     No results found.  Assessment/plan: Matteus Mcnelly is a 48 y.o. male present for  Chronic Conditions/illness Management Hypertriglyceridemia/Overweight (BMI 25.0-29.9) Routine exercise and dietary modifications recommended  Hypothyroidism: Stable Continue levothyroxine175 mcg Est with Endo Dr. Daine farmer ([facility])  GERD: Stable Continue omperazole 40 mg every day to every other day   B12 deficiency/Vitamin D  deficiency B12 and vit d collected at next lab draw Continue supplementations  Health maintenance: Colon cancer screening: cologuard ordered Influenza vaccine:  declined  Return for cpe (20 min).  Orders Placed This Encounter  Procedures   Cologuard    Meds ordered this encounter  Medications   omeprazole  (PRILOSEC) 40 MG capsule    Sig: Take 1 capsule (40 mg total) by mouth daily.    Dispense:  90 capsule    Refill:  3   Referral Orders  No referral(s) requested today     Note is dictated utilizing voice recognition software. Although note has been proof read prior to signing, occasional typographical errors still can be missed. If any questions arise, please do not hesitate to call for verification.  Electronically signed by: Charlies Bellini, DO [facility] [place]

## 2024-02-03 NOTE — Patient Instructions (Addendum)
Return for cpe (20 min).        Great to see you today.  I have refilled the medication(s) we provide.   If labs were collected or images ordered, we will inform you of  results once we have received them and reviewed. We will contact you either by echart message, or telephone call.  Please give ample time to the testing facility, and our office to run,  receive and review results. Please do not call inquiring of results, even if you can see them in your chart. We will contact you as soon as we are able. If it has been over 1 week since the test was completed, and you have not yet heard from [place], then please call us.    - echart message- for normal results that have been seen by the patient already.   - telephone call: abnormal results or if patient has not viewed results in their echart.  If a referral to a specialist was entered for you, please call us in 2 weeks if you have not heard from the specialist office to schedule.

## 2024-02-15 LAB — COLOGUARD: COLOGUARD: NEGATIVE

## 2024-02-20 ENCOUNTER — Ambulatory Visit: Payer: Self-pay | Admitting: Family Medicine
